# Patient Record
Sex: Female | Born: 1940
Health system: Southern US, Community
[De-identification: ages and names within clinical notes are randomized; demographics above are authoritative.]

## PROBLEM LIST (undated history)

## (undated) DIAGNOSIS — R569 Unspecified convulsions: Secondary | ICD-10-CM

## (undated) DIAGNOSIS — R32 Unspecified urinary incontinence: Secondary | ICD-10-CM

## (undated) DIAGNOSIS — J302 Other seasonal allergic rhinitis: Secondary | ICD-10-CM

## (undated) DIAGNOSIS — H269 Unspecified cataract: Secondary | ICD-10-CM

## (undated) DIAGNOSIS — I10 Essential (primary) hypertension: Secondary | ICD-10-CM

## (undated) DIAGNOSIS — E119 Type 2 diabetes mellitus without complications: Secondary | ICD-10-CM

## (undated) DIAGNOSIS — M1990 Unspecified osteoarthritis, unspecified site: Secondary | ICD-10-CM

## (undated) HISTORY — PX: TUBAL LIGATION: SHX77

## (undated) HISTORY — PX: EYE SURGERY: SHX253

---

## 1997-08-28 ENCOUNTER — Ambulatory Visit (HOSPITAL_COMMUNITY): Admission: RE | Admit: 1997-08-28 | Discharge: 1997-08-28 | Payer: Self-pay | Admitting: Family Medicine

## 1997-09-06 ENCOUNTER — Ambulatory Visit (HOSPITAL_COMMUNITY): Admission: RE | Admit: 1997-09-06 | Discharge: 1997-09-06 | Payer: Self-pay | Admitting: Family Medicine

## 2006-06-01 ENCOUNTER — Encounter: Admission: RE | Admit: 2006-06-01 | Discharge: 2006-06-01 | Payer: Self-pay | Admitting: Family Medicine

## 2007-12-24 ENCOUNTER — Ambulatory Visit (HOSPITAL_COMMUNITY): Admission: RE | Admit: 2007-12-24 | Discharge: 2007-12-24 | Payer: Self-pay | Admitting: Ophthalmology

## 2008-08-03 ENCOUNTER — Other Ambulatory Visit: Admission: RE | Admit: 2008-08-03 | Discharge: 2008-08-03 | Payer: Self-pay | Admitting: Family Medicine

## 2008-08-17 ENCOUNTER — Encounter (INDEPENDENT_AMBULATORY_CARE_PROVIDER_SITE_OTHER): Payer: Self-pay | Admitting: Ophthalmology

## 2008-08-17 ENCOUNTER — Ambulatory Visit (HOSPITAL_COMMUNITY): Admission: RE | Admit: 2008-08-17 | Discharge: 2008-08-18 | Payer: Self-pay | Admitting: Ophthalmology

## 2010-06-24 LAB — BASIC METABOLIC PANEL
CO2: 25 mEq/L (ref 19–32)
Calcium: 9.6 mg/dL (ref 8.4–10.5)
GFR calc Af Amer: >60 mL/min (ref >60)
Glucose, Bld: 123 mg/dL — ABNORMAL HIGH (ref 70–99)
Sodium: 140 mEq/L (ref 135–145)

## 2010-06-24 LAB — GLUCOSE, CAPILLARY
Glucose-Capillary: 135 mg/dL — ABNORMAL HIGH (ref 70–99)
Glucose-Capillary: 213 mg/dL — ABNORMAL HIGH (ref 70–99)

## 2010-06-24 LAB — CBC
Hemoglobin: 13.3 g/dL (ref 12.0–15.0)
MCHC: 33.3 g/dL (ref 30.0–36.0)
MCV: 77.8 fL — ABNORMAL LOW (ref 78.0–100.0)
WBC: 7.9 10*3/uL (ref 4.0–10.5)

## 2010-07-30 NOTE — Op Note (Signed)
NAME:  Kelly Golden, Kelly Golden NO.:  [id number]   MEDICAL RECORD NO.:  [id number]          PATIENT TYPE:  OIB   LOCATION:  5127                         FACILITY:  [facility]   PHYSICIAN:  Beulah Gandy. Ashley Royalty, M.D. DATE OF BIRTH:  1940/07/26   DATE OF PROCEDURE:  08/17/2008  DATE OF DISCHARGE:                               OPERATIVE REPORT   ADMISSION DIAGNOSIS:  Retained lens material, anterior chamber  intraocular lens, left eye.   PROCEDURES:  Pars plana vitrectomy, retinal photocoagulation, removal of  intraocular lens, removal of retained lens material, placement of  secondary intraocular lens with suture, and gas fluid exchange of the  left eye.   SURGEON:  Beulah Gandy. Ashley Royalty, MD   ASSISTANT:  Rosalie Doctor, MA   ANESTHESIA:  General.   DETAILS:  Usual prep and drape.  Peritomy from 8 o'clock around to 4  o'clock to expose the superior globe.  Scleral flaps were raised at 3  and 9 o'clock in anticipation of IOL suture.  The 25-gauge trocars were  placed at 10, 2, and 4 o'clock.  A three-layered corneal wound was  performed between 10 and 2 o'clock.  The pars plana vitrectomy was begun  in the pupillary axis.  The lighted pick and the cutter were placed  through the trocars into the vitreous cavity.  Retained lens material  was encountered and removed carefully under low suction and rapid  cutting.  Provisc was placed on the corneal surface, and the BIOM  viewing system was moved into place.  The vitrectomy was carried down to  the vitreous base for 360 degrees and it was trimmed.  All lens remnants  were removed.  The instruments were removed from the eye and the  indirect ophthalmoscope laser was moved into place.  Burns 629 were  placed in 2 rows in the retinal periphery.  The power was 360  milliwatts, 1000 microns each, and 0.1 seconds each.  The 3-layered  corneoscleral wound was opened, and the anterior chamber intraocular  lens was dialed out of the anterior  chamber.  Two Prolene sutures were  passed behind the iris and anterior to the capsular remnants from 3  o'clock to 9 o'clock.  The sutures were externalized through the corneal  wound.  A new intraocular lens made by [employer] model  CZ70BD, power 20.5 D, length 12.5 mm, optic 7.0 mm, serial number  [id number] was brought onto the field, expiration date March 2014.  The Prolene sutures were attached to the eyelets of the lens, and the  lens was placed in the posterior chamber, then dialed into place.  The  sutures were knotted externally beneath the scleral flaps.  The  vitrectomy was again performed removing additional pigment granules from  the vitreous cavity.  Wound was closed with interrupted 10-0 nylon  sutures.  Additional sutures were required to assure that there was no  leakage from the wound.  The wound was closed until tight.  A 30% gas  fluid exchange was carried out in the vitreous cavity.  The instruments  were removed from the eye, and the trocars were  removed from the pars  plana area.  The conjunctiva was closed with 7-0 chromic suture.  Polymyxin and gentamicin were irrigated into [place] space.  Atropine  solution was applied.  Marcaine was injected around the globe for postop  pain.  Decadron 10 mg was injected into the lower subconjunctival space.  The closing pressure was 10 with a Baer keratometer.  Polysporin and  patch and shield were placed.   COMPLICATIONS:  None.   DURATION:  Two hours.   The patient was awakened and taken to recovery room in satisfactory  condition.      Beulah Gandy. Ashley Royalty, M.D.  Electronically Signed     JDM/MEDQ  D:  08/17/2008  T:  08/18/2008  Job:  [id number]

## 2010-08-02 NOTE — Op Note (Signed)
NAME:  Kelly Golden, LARGE NO.:  [id number]   MEDICAL RECORD NO.:  [id number]           PATIENT TYPE:   LOCATION:                                 FACILITY:   PHYSICIAN:  Salley Scarlet., M.D.DATE OF BIRTH:  1940/07/31   DATE OF PROCEDURE:  DATE OF DISCHARGE:                               OPERATIVE REPORT   PREOPERATIVE DIAGNOSIS:  Immature cataract, left eye.   POSTOPERATIVE DIAGNOSIS:  Immature cataract, left eye.   OPERATIONS:  Kelman phacoemulsification cataract left eye with  intraocular lens implantation.   ANESTHESIA:  Local using Xylocaine 2% with Marcaine and 0.75% [place].   JUSTIFICATION FOR PROCEDURE:  This is a 70 year old lady who complains  of blurring of vision associated with difficulty seeing to read and  drive.  She was evaluated and found to have bilateral immature cataracts  with a visual acuity best corrected to 20/50 on the right and 20/60 on  the left.  There were bilateral 2+ nuclear cataracts associated  posterior subcapsular opacities.  Cataract extraction with intraocular  lens implantation was recommended.  She was admitted at this time for  that purpose.   PROCEDURE:  Under influence of IV sedation, van Lint akinesia and  retrobulbar anesthesia was given.  The patient was prepped and draped in  the usual manner.  A lid speculum was inserted under the upper and lower  lid of the left eye, and a 4-0 silk traction suture was passed through  the belly of the superior rectus muscle retraction.  A fornix-based  conjunctival flap was turned and hemostasis was achieved by using  cautery.  An incision was made in the sclera at the limbus.  This  incision was dissected down to clear cornea using crescent blade.  A  sideport incision was made at 1:30 o'clock position.  OcuCoat was  injected into the eye through the sideport incision.  The anterior  chamber was then entered through the corneoscleral tunnel incision at  the 11:30 o'clock  position using a 2.75-mm keratome.  An anterior  capsulotomy was done using a bent 25-gauge needle.  The nucleus was  hydrodissected using Xylocaine.  The KPE handpiece was passed into the  eye, and the nucleus was begun to be emulsified.  It was during the  emulsification that the pupil became quite miotic and it became even  more difficult to emulsify the nucleus.  Near the end of the  emulsification, it was noted that there was a tear in the posterior  capsule.  The residual nucleus was emulsified and cortical material was  aspirated.  Anterior chamber became to be filled with vitreous.  An  anterior vitrectomy was done.  The pupils were constricted and the  anterior chamber was reformed using Miochol.  OcuCoat was injected into  the eye.  The corneoscleral wound was widened to accommodate a PMMA  anterior chamber lens.  The lens was seated into the eye across the  pupil and rotated in a horizontal fashion.  A peripheral iridectomy was  made in the iris.  The wound was tested to make sure there was no  vitreous in  the wound or in the anterior chamber.  The corneoscleral  wound was then closed using a combination of interrupted sutures of 8-0  Vicryl and 10-0 nylon.  After ascertaining that the wound was airtight  and watertight, the conjunctiva was closed over the wound using thermal  cautery.  A 1 mL of Celestone and 0.5 mL of gentamicin were injected  subconjunctivally.  Maxitrol ophthalmic ointment and Pilocarpine  ointment were applied along with a patch and Fox shield.  The patient  tolerated the procedure well and was discharged to the postanesthesia  recovery room in satisfactory condition.  She was  instructed to rest today, to take Vicodin every 4 hours as needed for  pain and see me in the office tomorrow for further evaluation.   DISCHARGE DIAGNOSIS:  Immature cataract, left eye.      Salley Scarlet., M.D.  Electronically Signed     TB/MEDQ  D:  12/26/2007  T:   12/27/2007  Job:  [id number]

## 2010-12-17 LAB — URINALYSIS, ROUTINE W REFLEX MICROSCOPIC
Bilirubin Urine: NEGATIVE
Protein, ur: NEGATIVE
Specific Gravity, Urine: 1.012

## 2010-12-17 LAB — CBC
HCT: 41.4
MCV: 77.5 — ABNORMAL LOW
RDW: 14.2

## 2010-12-17 LAB — GLUCOSE, CAPILLARY: Glucose-Capillary: 119 — ABNORMAL HIGH

## 2010-12-17 LAB — BASIC METABOLIC PANEL: Calcium: 9.4

## 2010-12-23 ENCOUNTER — Emergency Department (HOSPITAL_COMMUNITY)
Admission: EM | Admit: 2010-12-23 | Discharge: 2010-12-23 | Disposition: A | Discharge status: Home / Self Care | Payer: Medicare Other | Attending: Emergency Medicine | Admitting: Emergency Medicine

## 2010-12-23 DIAGNOSIS — E119 Type 2 diabetes mellitus without complications: Secondary | ICD-10-CM | POA: Insufficient documentation

## 2010-12-23 DIAGNOSIS — S0180XA Unspecified open wound of other part of head, initial encounter: Secondary | ICD-10-CM | POA: Insufficient documentation

## 2010-12-23 DIAGNOSIS — I10 Essential (primary) hypertension: Secondary | ICD-10-CM | POA: Insufficient documentation

## 2010-12-23 DIAGNOSIS — Y92009 Unspecified place in unspecified non-institutional (private) residence as the place of occurrence of the external cause: Secondary | ICD-10-CM | POA: Insufficient documentation

## 2010-12-23 DIAGNOSIS — W010XXA Fall on same level from slipping, tripping and stumbling without subsequent striking against object, initial encounter: Secondary | ICD-10-CM | POA: Insufficient documentation

## 2010-12-27 ENCOUNTER — Emergency Department (HOSPITAL_COMMUNITY)
Admission: EM | Admit: 2010-12-27 | Discharge: 2010-12-27 | Disposition: A | Discharge status: Home / Self Care | Payer: Medicare Other | Attending: Emergency Medicine | Admitting: Emergency Medicine

## 2010-12-27 DIAGNOSIS — E119 Type 2 diabetes mellitus without complications: Secondary | ICD-10-CM | POA: Insufficient documentation

## 2010-12-27 DIAGNOSIS — Z4802 Encounter for removal of sutures: Secondary | ICD-10-CM | POA: Insufficient documentation

## 2010-12-27 DIAGNOSIS — I10 Essential (primary) hypertension: Secondary | ICD-10-CM | POA: Insufficient documentation

## 2011-11-06 ENCOUNTER — Ambulatory Visit (INDEPENDENT_AMBULATORY_CARE_PROVIDER_SITE_OTHER): Payer: Medicare Other | Admitting: Ophthalmology

## 2011-11-06 DIAGNOSIS — E11319 Type 2 diabetes mellitus with unspecified diabetic retinopathy without macular edema: Secondary | ICD-10-CM

## 2011-11-06 DIAGNOSIS — E1165 Type 2 diabetes mellitus with hyperglycemia: Secondary | ICD-10-CM

## 2011-11-06 DIAGNOSIS — I10 Essential (primary) hypertension: Secondary | ICD-10-CM

## 2011-11-06 DIAGNOSIS — H2510 Age-related nuclear cataract, unspecified eye: Secondary | ICD-10-CM

## 2011-11-06 DIAGNOSIS — H35039 Hypertensive retinopathy, unspecified eye: Secondary | ICD-10-CM

## 2011-11-06 DIAGNOSIS — H43819 Vitreous degeneration, unspecified eye: Secondary | ICD-10-CM

## 2011-11-20 ENCOUNTER — Other Ambulatory Visit (INDEPENDENT_AMBULATORY_CARE_PROVIDER_SITE_OTHER): Payer: Medicare Other | Admitting: Ophthalmology

## 2011-11-20 DIAGNOSIS — H3581 Retinal edema: Secondary | ICD-10-CM

## 2011-12-01 ENCOUNTER — Ambulatory Visit (INDEPENDENT_AMBULATORY_CARE_PROVIDER_SITE_OTHER): Payer: Medicare Other | Admitting: Ophthalmology

## 2011-12-01 DIAGNOSIS — E1139 Type 2 diabetes mellitus with other diabetic ophthalmic complication: Secondary | ICD-10-CM

## 2011-12-01 DIAGNOSIS — E1165 Type 2 diabetes mellitus with hyperglycemia: Secondary | ICD-10-CM

## 2011-12-01 DIAGNOSIS — H3581 Retinal edema: Secondary | ICD-10-CM

## 2012-04-07 ENCOUNTER — Ambulatory Visit (INDEPENDENT_AMBULATORY_CARE_PROVIDER_SITE_OTHER): Payer: Medicare Other | Admitting: Ophthalmology

## 2012-04-07 DIAGNOSIS — H43819 Vitreous degeneration, unspecified eye: Secondary | ICD-10-CM

## 2012-04-07 DIAGNOSIS — I10 Essential (primary) hypertension: Secondary | ICD-10-CM

## 2012-04-07 DIAGNOSIS — H35039 Hypertensive retinopathy, unspecified eye: Secondary | ICD-10-CM

## 2012-04-07 DIAGNOSIS — E11319 Type 2 diabetes mellitus with unspecified diabetic retinopathy without macular edema: Secondary | ICD-10-CM

## 2012-04-07 DIAGNOSIS — H2510 Age-related nuclear cataract, unspecified eye: Secondary | ICD-10-CM

## 2012-04-07 DIAGNOSIS — E1165 Type 2 diabetes mellitus with hyperglycemia: Secondary | ICD-10-CM

## 2012-06-04 ENCOUNTER — Other Ambulatory Visit: Payer: Self-pay | Admitting: Ophthalmology

## 2012-06-04 NOTE — H&P (Signed)
  Pre-operative History and Physical for Ophthalmic Surgery  Kelly Golden 06/04/2012                  Chief Complaint: Decreased vision right eye  Diagnosis:  Combined Cataract  Allergies not on file Seasonal Allergies Only Prior to Admission medications   Not on File    Planned Procedure:                                       Phacoemulsification, Posterior Chamber Intra-ocular Lens Right Eye                                       Acrysof MA50BM + 21.50 Diopter PC IOL for implant OD    There were no vitals filed for this visit.  Pulse: 80         Temp: NE        Resp:  16     ROS:   No past surgical history on file.   History   Social History  . Marital Status: Married    Spouse Name: N/A    Number of Children: N/A  . Years of Education: N/A   Occupational History  . Not on file.   Social History Main Topics  . Smoking status: Not on file  . Smokeless tobacco: Not on file  . Alcohol Use: Not on file  . Drug Use: Not on file  . Sexually Active: Not on file   Other Topics Concern  . Not on file   Social History Narrative  . No narrative on file     The following examination is for anesthesia clearance for minimally invasive Ophthalmic surgery. It is primarily to document heart and lung findings and is not intended to elucidate unknown general medical conditions inclusive of abdominal masses, lung lesions, etc.   General Constitution:  within normal limits   Alertness/Orientation:  Person, time place     yes   HEENT:  Eye Findings:  Combined Cataract                   right eye  Neck: supple without masses  Chest/Lungs: clear to auscultation  Cardiac: Normal S1 and S2 without Murmur, S3 or S4  Neuro: non-focal   Impression:  Combined Cataract  Planned Procedure:  Phacoemulsification, Posterior Chamber Intraocular Lens, Right Eye     Shade Flood, MD

## 2012-06-22 ENCOUNTER — Encounter (HOSPITAL_COMMUNITY): Payer: Self-pay

## 2012-06-24 ENCOUNTER — Encounter (HOSPITAL_COMMUNITY)
Admission: RE | Admit: 2012-06-24 | Discharge: 2012-06-24 | Disposition: A | Discharge status: Home / Self Care | Payer: Medicare Other | Source: Ambulatory Visit | Attending: Ophthalmology | Admitting: Ophthalmology

## 2012-06-24 ENCOUNTER — Encounter (HOSPITAL_COMMUNITY): Payer: Self-pay

## 2012-06-24 HISTORY — DX: Other seasonal allergic rhinitis: J30.2

## 2012-06-24 HISTORY — DX: Unspecified convulsions: R56.9

## 2012-06-24 HISTORY — DX: Essential (primary) hypertension: I10

## 2012-06-24 HISTORY — DX: Type 2 diabetes mellitus without complications: E11.9

## 2012-06-24 LAB — CBC
HCT: 41.2 % (ref 36.0–46.0)
Hemoglobin: 14.2 g/dL (ref 12.0–15.0)
MCHC: 34.5 g/dL (ref 30.0–36.0)
RBC: 5.52 MIL/uL — ABNORMAL HIGH (ref 3.87–5.11)

## 2012-06-24 LAB — BASIC METABOLIC PANEL
BUN: 20 mg/dL (ref 6–23)
Chloride: 100 mEq/L (ref 96–112)
GFR calc Af Amer: 71 mL/min — ABNORMAL LOW (ref >90)
GFR calc non Af Amer: 61 mL/min — ABNORMAL LOW (ref >90)
Glucose, Bld: 99 mg/dL (ref 70–99)
Potassium: 3.9 mEq/L (ref 3.5–5.1)
Sodium: 136 mEq/L (ref 135–145)

## 2012-06-24 NOTE — Pre-Procedure Instructions (Signed)
Kelly Golden  06/24/2012   Your procedure is scheduled on:  Wednesday, April 16th  Report to [facility] at 0830 AM.  Call this number if you have problems the morning of surgery: [phone]   Remember:   Do not eat food or drink liquids after midnight.    Take these medicines the morning of surgery with A SIP OF WATER: Diltazem   Do not wear jewelry, make-up or nail polish.  Do not wear lotions, powders, or perfumes, deodorant.  Do not shave 48 hours prior to surgery.   Do not bring valuables to the hospital.  Contacts, dentures or bridgework may not be worn into surgery.  Leave suitcase in the car. After surgery it may be brought to your room.  For patients admitted to the hospital, checkout time is 11:00 AM the day of discharge.   Patients discharged the day of surgery will not be allowed to drive home.    Special Instructions: Shower using CHG 2 nights before surgery and the night before surgery.  If you shower the day of surgery use CHG.  Use special wash - you have one bottle of CHG for all showers.  You should use approximately 1/3 of the bottle for each shower.   Please read over the following fact sheets that you were given: Pain Booklet, Coughing and Deep Breathing and Surgical Site Infection Prevention

## 2012-06-30 ENCOUNTER — Encounter (HOSPITAL_COMMUNITY): Payer: Self-pay | Admitting: Anesthesiology

## 2012-06-30 ENCOUNTER — Ambulatory Visit (HOSPITAL_COMMUNITY)
Admission: RE | Admit: 2012-06-30 | Discharge: 2012-06-30 | Disposition: A | Discharge status: Home / Self Care | Payer: Medicare Other | Source: Ambulatory Visit | Attending: Ophthalmology | Admitting: Ophthalmology

## 2012-06-30 ENCOUNTER — Ambulatory Visit (HOSPITAL_COMMUNITY): Payer: Medicare Other | Admitting: Anesthesiology

## 2012-06-30 ENCOUNTER — Encounter (HOSPITAL_COMMUNITY): Payer: Self-pay | Admitting: *Deleted

## 2012-06-30 ENCOUNTER — Encounter (HOSPITAL_COMMUNITY)
Admission: RE | Disposition: A | Discharge status: Home / Self Care | Payer: Self-pay | Source: Ambulatory Visit | Attending: Ophthalmology

## 2012-06-30 DIAGNOSIS — H2510 Age-related nuclear cataract, unspecified eye: Secondary | ICD-10-CM | POA: Insufficient documentation

## 2012-06-30 HISTORY — PX: CATARACT EXTRACTION W/PHACO: SHX586

## 2012-06-30 LAB — GLUCOSE, CAPILLARY
Glucose-Capillary: 141 mg/dL — ABNORMAL HIGH (ref 70–99)
Glucose-Capillary: 105 mg/dL — ABNORMAL HIGH (ref 70–99)

## 2012-06-30 SURGERY — PHACOEMULSIFICATION, CATARACT, WITH IOL INSERTION
Anesthesia: Monitor Anesthesia Care | Site: Eye | Laterality: Right | Wound class: Clean

## 2012-06-30 MED ORDER — ONDANSETRON HCL 4 MG/2ML IJ SOLN: 4.0000 mg | Freq: Four times a day (QID) | INTRAMUSCULAR | Status: DC | PRN

## 2012-06-30 MED ORDER — BACITRACIN-POLYMYXIN B 500-10000 UNIT/GM OP OINT
TOPICAL_OINTMENT | OPHTHALMIC | Status: AC
  Filled 2012-06-30: qty 3.5

## 2012-06-30 MED ORDER — HYPROMELLOSE (GONIOSCOPIC) 2.5 % OP SOLN
OPHTHALMIC | Status: AC
  Filled 2012-06-30: qty 15

## 2012-06-30 MED ORDER — BUPIVACAINE HCL (PF) 0.75 % IJ SOLN
INTRAMUSCULAR | Status: AC
  Filled 2012-06-30: qty 10

## 2012-06-30 MED ORDER — PHENYLEPHRINE HCL 2.5 % OP SOLN
1.0000 [drp] | OPHTHALMIC | Status: DC | PRN
  Administered 2012-06-30: 1 [drp] via OPHTHALMIC
  Filled 2012-06-30: qty 3

## 2012-06-30 MED ORDER — NA CHONDROIT SULF-NA HYALURON 40-30 MG/ML IO SOLN
INTRAOCULAR | Status: DC | PRN
  Administered 2012-06-30 (×2): 0.5 mL via INTRAOCULAR

## 2012-06-30 MED ORDER — OXYCODONE HCL 5 MG PO TABS: 5.0000 mg | ORAL_TABLET | Freq: Once | ORAL | Status: DC | PRN

## 2012-06-30 MED ORDER — DEXAMETHASONE SODIUM PHOSPHATE 10 MG/ML IJ SOLN
INTRAMUSCULAR | Status: DC | PRN
  Administered 2012-06-30: 10 mg

## 2012-06-30 MED ORDER — FENTANYL CITRATE 0.05 MG/ML IJ SOLN: 25.0000 ug | INTRAMUSCULAR | Status: DC | PRN

## 2012-06-30 MED ORDER — TRIAMCINOLONE ACETONIDE 40 MG/ML IJ SUSP
INTRAMUSCULAR | Status: AC
  Filled 2012-06-30: qty 5

## 2012-06-30 MED ORDER — EPINEPHRINE HCL 1 MG/ML IJ SOLN
INTRAMUSCULAR | Status: AC
  Filled 2012-06-30: qty 1

## 2012-06-30 MED ORDER — DEXAMETHASONE SODIUM PHOSPHATE 10 MG/ML IJ SOLN
INTRAMUSCULAR | Status: AC
  Filled 2012-06-30: qty 1

## 2012-06-30 MED ORDER — ACETYLCHOLINE CHLORIDE 1:100 IO SOLR
INTRAOCULAR | Status: AC
  Filled 2012-06-30: qty 1

## 2012-06-30 MED ORDER — PROVISC 10 MG/ML IO SOLN
INTRAOCULAR | Status: DC | PRN
  Administered 2012-06-30: 8.5 mg via INTRAOCULAR

## 2012-06-30 MED ORDER — NA CHONDROIT SULF-NA HYALURON 40-30 MG/ML IO SOLN
INTRAOCULAR | Status: AC
  Filled 2012-06-30: qty 0.5

## 2012-06-30 MED ORDER — PROPOFOL 10 MG/ML IV BOLUS
INTRAVENOUS | Status: DC | PRN
  Administered 2012-06-30: 40 mg via INTRAVENOUS

## 2012-06-30 MED ORDER — HYPROMELLOSE (GONIOSCOPIC) 2.5 % OP SOLN
OPHTHALMIC | Status: DC | PRN
  Administered 2012-06-30: 2 [drp] via OPHTHALMIC

## 2012-06-30 MED ORDER — BACITRACIN-POLYMYXIN B 500-10000 UNIT/GM OP OINT
TOPICAL_OINTMENT | OPHTHALMIC | Status: DC | PRN
  Administered 2012-06-30: 1 via OPHTHALMIC

## 2012-06-30 MED ORDER — EPINEPHRINE HCL 1 MG/ML IJ SOLN
INTRAOCULAR | Status: DC | PRN
  Administered 2012-06-30: 11:00:00

## 2012-06-30 MED ORDER — SODIUM CHLORIDE 0.9 % IV SOLN
INTRAVENOUS | Status: DC | PRN
  Administered 2012-06-30: 10:00:00 via INTRAVENOUS

## 2012-06-30 MED ORDER — LIDOCAINE HCL 2 % IJ SOLN
INTRAMUSCULAR | Status: DC | PRN
  Administered 2012-06-30: 11:00:00 via RETROBULBAR

## 2012-06-30 MED ORDER — GATIFLOXACIN 0.5 % OP SOLN
1.0000 [drp] | OPHTHALMIC | Status: DC | PRN
  Administered 2012-06-30: 1 [drp] via OPHTHALMIC
  Filled 2012-06-30: qty 2.5

## 2012-06-30 MED ORDER — PREDNISOLONE ACETATE 1 % OP SUSP
1.0000 [drp] | OPHTHALMIC | Status: AC
  Administered 2012-06-30: 1 [drp] via OPHTHALMIC
  Filled 2012-06-30: qty 5

## 2012-06-30 MED ORDER — OXYCODONE HCL 5 MG/5ML PO SOLN: 5.0000 mg | Freq: Once | ORAL | Status: DC | PRN

## 2012-06-30 MED ORDER — ACETYLCHOLINE CHLORIDE 1:100 IO SOLR
INTRAOCULAR | Status: DC | PRN
  Administered 2012-06-30: 20 mg via INTRAOCULAR

## 2012-06-30 MED ORDER — CEFAZOLIN SUBCONJUNCTIVAL INJECTION 100 MG/0.5 ML
200.0000 mg | INJECTION | SUBCONJUNCTIVAL | Status: DC
  Filled 2012-06-30: qty 1

## 2012-06-30 MED ORDER — BSS IO SOLN
INTRAOCULAR | Status: AC
  Filled 2012-06-30: qty 500

## 2012-06-30 MED ORDER — TETRACAINE HCL 0.5 % OP SOLN
2.0000 [drp] | OPHTHALMIC | Status: AC
  Administered 2012-06-30 (×2): 2 [drp] via OPHTHALMIC
  Filled 2012-06-30: qty 2

## 2012-06-30 MED ORDER — SODIUM HYALURONATE 10 MG/ML IO SOLN
INTRAOCULAR | Status: AC
  Filled 2012-06-30: qty 0.85

## 2012-06-30 MED ORDER — LIDOCAINE HCL 2 % IJ SOLN
INTRAMUSCULAR | Status: AC
  Filled 2012-06-30: qty 20

## 2012-06-30 MED ORDER — FENTANYL CITRATE 0.05 MG/ML IJ SOLN
INTRAMUSCULAR | Status: DC | PRN
  Administered 2012-06-30: 25 ug via INTRAVENOUS
  Administered 2012-06-30: 75 ug via INTRAVENOUS

## 2012-06-30 MED ORDER — TETRACAINE HCL 0.5 % OP SOLN
OPHTHALMIC | Status: AC
  Filled 2012-06-30: qty 2

## 2012-06-30 MED ORDER — CEFAZOLIN SUBCONJUNCTIVAL INJECTION 100 MG/0.5 ML
INJECTION | SUBCONJUNCTIVAL | Status: DC | PRN
  Administered 2012-06-30: 100 mg via SUBCONJUNCTIVAL

## 2012-06-30 MED ORDER — ACETAZOLAMIDE SODIUM 500 MG IJ SOLR
INTRAMUSCULAR | Status: AC
  Filled 2012-06-30: qty 500

## 2012-06-30 SURGICAL SUPPLY — 64 items
APL SRG 3 HI ABS STRL LF PLS (MISCELLANEOUS) ×1
APPLICATOR COTTON TIP 6IN STRL (MISCELLANEOUS) ×2 IMPLANT
APPLICATOR DR MATTHEWS STRL (MISCELLANEOUS) ×2 IMPLANT
BAG FLD CLT MN 6.25X3.5 (WOUND CARE) ×1
BAG MINI COLL DRAIN (WOUND CARE) ×2 IMPLANT
BLADE EYE MINI 60D BEAVER (BLADE) IMPLANT
BLADE KERATOME 2.75 (BLADE) ×2 IMPLANT
BLADE STAB KNIFE 15DEG (BLADE) IMPLANT
CANNULA ANTERIOR CHAMBER 27GA (MISCELLANEOUS) IMPLANT
CLOTH BEACON ORANGE TIMEOUT ST (SAFETY) ×2 IMPLANT
DRAPE OPHTHALMIC 77X100 STRL (CUSTOM PROCEDURE TRAY) ×2 IMPLANT
DRAPE POUCH INSTRU U-SHP 10X18 (DRAPES) ×2 IMPLANT
DRSG TEGADERM 4X4.75 (GAUZE/BANDAGES/DRESSINGS) ×2 IMPLANT
FILTER BLUE MILLIPORE (MISCELLANEOUS) IMPLANT
GLOVE SS BIOGEL STRL SZ 6.5 (GLOVE) ×1 IMPLANT
GLOVE SUPERSENSE BIOGEL SZ 6.5 (GLOVE) ×1
GOWN SRG XL XLNG 56XLVL 4 (GOWN DISPOSABLE) ×1 IMPLANT
GOWN STRL NON-REIN LRG LVL3 (GOWN DISPOSABLE) ×2 IMPLANT
GOWN STRL NON-REIN XL XLG LVL4 (GOWN DISPOSABLE) ×2
KIT BASIN OR (CUSTOM PROCEDURE TRAY) ×2 IMPLANT
KIT ROOM TURNOVER OR (KITS) IMPLANT
KNIFE GRIESHABER SHARP 2.5MM (MISCELLANEOUS) ×2 IMPLANT
LENS IOL ACRYSOF MP POST 21.0 (Intraocular Lens) ×1 IMPLANT
MASK EYE SHIELD (GAUZE/BANDAGES/DRESSINGS) ×1 IMPLANT
NDL 18GX1X1/2 (RX/OR ONLY) (NEEDLE) IMPLANT
NDL 25GX 5/8IN NON SAFETY (NEEDLE) ×1 IMPLANT
NDL FILTER BLUNT 18X1 1/2 (NEEDLE) IMPLANT
NDL HYPO 30X.5 LL (NEEDLE) ×2 IMPLANT
NEEDLE 18GX1X1/2 (RX/OR ONLY) (NEEDLE) IMPLANT
NEEDLE 22X1 1/2 (OR ONLY) (NEEDLE) ×2 IMPLANT
NEEDLE 25GX 5/8IN NON SAFETY (NEEDLE) ×2 IMPLANT
NEEDLE FILTER BLUNT 18X 1/2SAF (NEEDLE) ×1
NEEDLE FILTER BLUNT 18X1 1/2 (NEEDLE) ×1 IMPLANT
NEEDLE HYPO 30X.5 LL (NEEDLE) ×4 IMPLANT
NS IRRIG 1000ML POUR BTL (IV SOLUTION) ×2 IMPLANT
PACK CATARACT CUSTOM (CUSTOM PROCEDURE TRAY) ×2 IMPLANT
PACK CATARACT MCHSCP (PACKS) ×2 IMPLANT
PACK COMBINED CATERACT/VIT 23G (OPHTHALMIC RELATED) IMPLANT
PAD ARMBOARD 7.5X6 YLW CONV (MISCELLANEOUS) ×4 IMPLANT
PAD EYE OVAL STERILE LF (GAUZE/BANDAGES/DRESSINGS) ×1 IMPLANT
PHACO TIP KELMAN 45DEG (TIP) IMPLANT
PROBE ANTERIOR 20G W/INFUS NDL (MISCELLANEOUS) ×1 IMPLANT
RING MALYGIN (MISCELLANEOUS) IMPLANT
ROLLS DENTAL (MISCELLANEOUS) IMPLANT
SHUTTLE MONARCH TYPE A (NEEDLE) ×2 IMPLANT
SOLUTION ANTI FOG 6CC (MISCELLANEOUS) IMPLANT
SPEAR EYE SURG WECK-CEL (MISCELLANEOUS) ×2 IMPLANT
SUT ETHILON 10-0 CS-B-6CS-B-6 (SUTURE)
SUT ETHILON 5 0 P 3 18 (SUTURE)
SUT ETHILON 9 0 TG140 8 (SUTURE) IMPLANT
SUT NYLON ETHILON 5-0 P-3 1X18 (SUTURE) IMPLANT
SUT PLAIN 6 0 TG1408 (SUTURE) IMPLANT
SUT POLY NON ABSORB 10-0 8 STR (SUTURE) IMPLANT
SUT VICRYL 6 0 S 29 12 (SUTURE) IMPLANT
SUTURE EHLN 10-0 CS-B-6CS-B-6 (SUTURE) IMPLANT
SYR 20CC LL (SYRINGE) IMPLANT
SYR 5ML LL (SYRINGE) IMPLANT
SYR TB 1ML LUER SLIP (SYRINGE) IMPLANT
SYRINGE 10CC LL (SYRINGE) IMPLANT
TIP ABS 45DEG FLARED 0.9MM (TIP) ×2 IMPLANT
TOWEL OR 17X24 6PK STRL BLUE (TOWEL DISPOSABLE) ×4 IMPLANT
WATER STERILE IRR 1000ML POUR (IV SOLUTION) ×2 IMPLANT
WIPE INSTRUMENT ADHESIVE BACK (MISCELLANEOUS) ×2 IMPLANT
WIPE INSTRUMENT VISIWIPE 73X73 (MISCELLANEOUS) ×2 IMPLANT

## 2012-06-30 NOTE — Transfer of Care (Signed)
Immediate Anesthesia Transfer of Care Note  Patient: Kelly Golden  Procedure(s) Performed: Procedure(s): CATARACT EXTRACTION PHACO AND INTRAOCULAR LENS PLACEMENT (IOC) (Right)  Patient Location: PACU and Short Stay  Anesthesia Type:MAC  Level of Consciousness: awake, alert , oriented and sedated  Airway & Oxygen Therapy: Patient Spontanous Breathing  Post-op Assessment: Report given to PACU RN, Post -op Vital signs reviewed and stable and Patient moving all extremities  Post vital signs: Reviewed and stable  Complications: No apparent anesthesia complications

## 2012-06-30 NOTE — Interval H&P Note (Signed)
History and Physical Interval Note:  06/30/2012 9:49 AM  Kelly Golden  has presented today for surgery, with the diagnosis of Combined Cataract Right Eye  The various methods of treatment have been discussed with the patient and family. After consideration of risks, benefits and other options for treatment, the patient has consented to  Procedure(s): CATARACT EXTRACTION PHACO AND INTRAOCULAR LENS PLACEMENT (IOC) (Right) as a surgical intervention .  The patient's history has been reviewed, patient examined, no change in status, stable for surgery.  I have reviewed the patient's chart and labs.  Questions were answered to the patient's satisfaction.    Her best corrected acuity was 20/70 with manifiest refraction. She reports difficulty driving at night, recognizing faces and difficulty reading. Her vision decrease is consistent with her Combined 3+ nuclear/Cortical Cataract OD. Risks, benefits, alternatives to Cataract surgery were discussed and the patient expresses the desire to proceed with surgical correction of her Cataract OD.  Her AScan to assess implant lens power suggests +21.00 diopter power for an Acrysof MA50 BM PC IOL will be optimal.   [name]

## 2012-06-30 NOTE — Anesthesia Procedure Notes (Addendum)
Procedure Name: MAC Date/Time: 06/30/2012 10:50 AM Performed by: Fransisca Kaufmann Pre-anesthesia Checklist: Emergency Drugs available, Suction available and Patient being monitored Patient Re-evaluated:Patient Re-evaluated prior to inductionOxygen Delivery Method: Nasal cannula Preoxygenation: Pre-oxygenation with 100% oxygen Intubation Type: IV induction Placement Confirmation: positive ETCO2

## 2012-06-30 NOTE — Anesthesia Preprocedure Evaluation (Signed)
Anesthesia Evaluation  Patient identified by MRN, date of birth, ID band Patient awake    Reviewed: Allergy & Precautions, H&P , NPO status , Patient's Chart, lab work & pertinent test results  Airway Mallampati: II  Neck ROM: full    Dental   Pulmonary former smoker,          Cardiovascular hypertension,     Neuro/Psych Seizures -,     GI/Hepatic   Endo/Other  diabetes, Type 2obese  Renal/GU      Musculoskeletal   Abdominal   Peds  Hematology   Anesthesia Other Findings   Reproductive/Obstetrics                           Anesthesia Physical Anesthesia Plan  ASA: II  Anesthesia Plan: MAC   Post-op Pain Management:    Induction: Intravenous  Airway Management Planned: Simple Face Mask  Additional Equipment:   Intra-op Plan:   Post-operative Plan:   Informed Consent: I have reviewed the patients History and Physical, chart, labs and discussed the procedure including the risks, benefits and alternatives for the proposed anesthesia with the patient or authorized representative who has indicated his/her understanding and acceptance.     Plan Discussed with: CRNA and Surgeon  Anesthesia Plan Comments:         Anesthesia Quick Evaluation

## 2012-06-30 NOTE — H&P (View-Only) (Signed)
  Pre-operative History and Physical for Ophthalmic Surgery  Kelly Golden 06/04/2012                  Chief Complaint: Decreased vision right eye  Diagnosis:  Combined Cataract  Allergies not on file Seasonal Allergies Only Prior to Admission medications   Not on File    Planned Procedure:                                       Phacoemulsification, Posterior Chamber Intra-ocular Lens Right Eye                                       Acrysof MA50BM + 21.50 Diopter PC IOL for implant OD    There were no vitals filed for this visit.  Pulse: 80         Temp: NE        Resp:  16     ROS:   No past surgical history on file.   History   Social History  . Marital Status: Married    Spouse Name: N/A    Number of Children: N/A  . Years of Education: N/A   Occupational History  . Not on file.   Social History Main Topics  . Smoking status: Not on file  . Smokeless tobacco: Not on file  . Alcohol Use: Not on file  . Drug Use: Not on file  . Sexually Active: Not on file   Other Topics Concern  . Not on file   Social History Narrative  . No narrative on file     The following examination is for anesthesia clearance for minimally invasive Ophthalmic surgery. It is primarily to document heart and lung findings and is not intended to elucidate unknown general medical conditions inclusive of abdominal masses, lung lesions, etc.   General Constitution:  within normal limits   Alertness/Orientation:  Person, time place     yes   HEENT:  Eye Findings:  Combined Cataract                   right eye  Neck: supple without masses  Chest/Lungs: clear to auscultation  Cardiac: Normal S1 and S2 without Murmur, S3 or S4  Neuro: non-focal   Impression:  Combined Cataract  Planned Procedure:  Phacoemulsification, Posterior Chamber Intraocular Lens, Right Eye     Fleetwood Pierron, MD       

## 2012-06-30 NOTE — Op Note (Signed)
Kelly Golden 06/30/2012 Cataract: Combined, Nuclear  Procedure: Phacoemulsification, Posterior Chamber Intra-ocular Lens Operative Eye:  right eye  Surgeon: Shade Flood Estimated Blood Loss: minimal Specimens for Pathology:  None Complications: radial capsule tear during lens insertion  The patient was prepared and draped in the usual manner for ocular surgery on the right eye. A Cook lid speculum was placed. A peripheral clear corneal incision was made at the surgical limbus centered at the 11:00 meridian. A separate clear corneal stab incision was made with a 15 degree blade at the 2:00 meridian to permit bi-manual technique. Viscoat and  Provisc as an underlying layer next to the capsule was instilled into the anterior chamber through that incision.  A keratome was used to create a self sealing incision entering the anterior chamber at the 11:00 meridian. A capsulorhexis was performed using a bent 25g needle. The lens was hydrodissected and the nucleus was hydrodilineated using a Nichammin cannula. The Chang chopper was inserted and used to rotate the lens to insure adequate lens mobility. The phacoemulsification handpiece was inserted and a combined phaco-chop technique was employed, fracturing the lens into separate sections with subsequent removal with the phaco handpiece.   The I/A cannula was used to remove remaining lens cortex. Provisc was instilled and used to deepen the anterior chamber and posterior capsule bag. The Monarch injector was used to place a folded Acrysof MA50BM PC IOL, + 21.00  diopters, into the capsule bag. A Sinskey lens hook was used to dial in the trailing haptic. During the lens placement, the patient developed at radial tear at 3:00. I retrieved the lens haptic with the sinskey hook and placed the haptic and subsequently the optic into the sulcus position. The remaining haptic which was already in the capsule bag was then placed into the sulcus using a dailing  technique with the sinskey hook. A small amount of vitreous was noted at the nasal pupil margin. The anterior vitrectomy cutter was then used to remove the presenting vitreous. Miochol was then used to bring the pupil down. The IOL was well supported when tested by ballotment and centered at the close of the procedure.   BSS was used to bring IOP to the desired range and the wound was checked to insure it was watertight. Subconjunctival injections of Ancef 100/0.48ml and Dexamethasone 0.5 ml of a 10mg /66ml solution were placed without complication. The lid speculum and drapes were removed and the patient's eye was patched with Polymixin/Bacitracin ophthalmic ointment. An eye shield was placed and the patient was transferred alert and conversant from the operating room to the post-operative recovery area.   Shade Flood, MD

## 2012-06-30 NOTE — Preoperative (Signed)
Beta Blockers   Reason not to administer Beta Blockers:Not Applicable 

## 2012-07-01 ENCOUNTER — Encounter (HOSPITAL_COMMUNITY): Payer: Self-pay | Admitting: Ophthalmology

## 2012-07-07 ENCOUNTER — Ambulatory Visit (INDEPENDENT_AMBULATORY_CARE_PROVIDER_SITE_OTHER): Payer: Medicare Other | Admitting: Ophthalmology

## 2012-07-07 DIAGNOSIS — H35039 Hypertensive retinopathy, unspecified eye: Secondary | ICD-10-CM

## 2012-07-07 DIAGNOSIS — H43819 Vitreous degeneration, unspecified eye: Secondary | ICD-10-CM

## 2012-07-07 DIAGNOSIS — E1139 Type 2 diabetes mellitus with other diabetic ophthalmic complication: Secondary | ICD-10-CM

## 2012-07-07 DIAGNOSIS — E11319 Type 2 diabetes mellitus with unspecified diabetic retinopathy without macular edema: Secondary | ICD-10-CM

## 2012-07-07 DIAGNOSIS — I10 Essential (primary) hypertension: Secondary | ICD-10-CM

## 2012-07-07 NOTE — Anesthesia Postprocedure Evaluation (Signed)
Anesthesia Post Note  Patient: Kelly Golden  Procedure(s) Performed: Procedure(s) (LRB): CATARACT EXTRACTION PHACO AND INTRAOCULAR LENS PLACEMENT (IOC) (Right)  Anesthesia type: MAC  Patient location: PACU  Post pain: Pain level controlled and Adequate analgesia  Post assessment: Post-op Vital signs reviewed, Patient's Cardiovascular Status Stable and Respiratory Function Stable  Last Vitals:  Filed Vitals:   06/30/12 1151  BP: 131/46  Pulse: 55  Temp: 36.5 C  Resp: 20    Post vital signs: Reviewed and stable  Level of consciousness: awake, alert  and oriented  Complications: No apparent anesthesia complications

## 2012-07-22 DIAGNOSIS — M25569 Pain in unspecified knee: Secondary | ICD-10-CM | POA: Insufficient documentation

## 2012-10-06 ENCOUNTER — Ambulatory Visit (INDEPENDENT_AMBULATORY_CARE_PROVIDER_SITE_OTHER): Payer: Medicare Other | Admitting: Ophthalmology

## 2012-10-06 ENCOUNTER — Ambulatory Visit (INDEPENDENT_AMBULATORY_CARE_PROVIDER_SITE_OTHER): Payer: Self-pay | Admitting: Ophthalmology

## 2014-04-18 DIAGNOSIS — E119 Type 2 diabetes mellitus without complications: Secondary | ICD-10-CM | POA: Diagnosis not present

## 2014-06-08 DIAGNOSIS — H4011X1 Primary open-angle glaucoma, mild stage: Secondary | ICD-10-CM | POA: Diagnosis not present

## 2014-06-20 DIAGNOSIS — R7309 Other abnormal glucose: Secondary | ICD-10-CM | POA: Diagnosis not present

## 2014-06-20 DIAGNOSIS — M150 Primary generalized (osteo)arthritis: Secondary | ICD-10-CM | POA: Diagnosis not present

## 2014-06-20 DIAGNOSIS — E782 Mixed hyperlipidemia: Secondary | ICD-10-CM | POA: Diagnosis not present

## 2014-06-20 DIAGNOSIS — E0800 Diabetes mellitus due to underlying condition with hyperosmolarity without nonketotic hyperglycemic-hyperosmolar coma (NKHHC): Secondary | ICD-10-CM | POA: Diagnosis not present

## 2014-09-08 DIAGNOSIS — H4011X1 Primary open-angle glaucoma, mild stage: Secondary | ICD-10-CM | POA: Diagnosis not present

## 2014-09-08 DIAGNOSIS — H04123 Dry eye syndrome of bilateral lacrimal glands: Secondary | ICD-10-CM | POA: Diagnosis not present

## 2014-09-11 DIAGNOSIS — H00016 Hordeolum externum left eye, unspecified eyelid: Secondary | ICD-10-CM | POA: Diagnosis not present

## 2014-09-11 DIAGNOSIS — H4011X1 Primary open-angle glaucoma, mild stage: Secondary | ICD-10-CM | POA: Diagnosis not present

## 2014-11-30 DIAGNOSIS — I10 Essential (primary) hypertension: Secondary | ICD-10-CM | POA: Diagnosis not present

## 2014-11-30 DIAGNOSIS — E782 Mixed hyperlipidemia: Secondary | ICD-10-CM | POA: Diagnosis not present

## 2014-11-30 DIAGNOSIS — E0800 Diabetes mellitus due to underlying condition with hyperosmolarity without nonketotic hyperglycemic-hyperosmolar coma (NKHHC): Secondary | ICD-10-CM | POA: Diagnosis not present

## 2014-12-07 DIAGNOSIS — M25561 Pain in right knee: Secondary | ICD-10-CM | POA: Diagnosis not present

## 2014-12-07 DIAGNOSIS — M170 Bilateral primary osteoarthritis of knee: Secondary | ICD-10-CM | POA: Diagnosis not present

## 2014-12-07 DIAGNOSIS — M25562 Pain in left knee: Secondary | ICD-10-CM | POA: Diagnosis not present

## 2014-12-18 DIAGNOSIS — M170 Bilateral primary osteoarthritis of knee: Secondary | ICD-10-CM | POA: Diagnosis not present

## 2014-12-25 DIAGNOSIS — M170 Bilateral primary osteoarthritis of knee: Secondary | ICD-10-CM | POA: Diagnosis not present

## 2015-01-01 DIAGNOSIS — M170 Bilateral primary osteoarthritis of knee: Secondary | ICD-10-CM | POA: Diagnosis not present

## 2015-01-08 DIAGNOSIS — M170 Bilateral primary osteoarthritis of knee: Secondary | ICD-10-CM | POA: Diagnosis not present

## 2015-03-22 DIAGNOSIS — Z1231 Encounter for screening mammogram for malignant neoplasm of breast: Secondary | ICD-10-CM | POA: Diagnosis not present

## 2015-04-03 DIAGNOSIS — M150 Primary generalized (osteo)arthritis: Secondary | ICD-10-CM | POA: Diagnosis not present

## 2015-04-03 DIAGNOSIS — I10 Essential (primary) hypertension: Secondary | ICD-10-CM | POA: Diagnosis not present

## 2015-04-03 DIAGNOSIS — E089 Diabetes mellitus due to underlying condition without complications: Secondary | ICD-10-CM | POA: Diagnosis not present

## 2015-04-03 DIAGNOSIS — E782 Mixed hyperlipidemia: Secondary | ICD-10-CM | POA: Diagnosis not present

## 2015-08-01 DIAGNOSIS — E782 Mixed hyperlipidemia: Secondary | ICD-10-CM | POA: Diagnosis not present

## 2015-08-01 DIAGNOSIS — I10 Essential (primary) hypertension: Secondary | ICD-10-CM | POA: Diagnosis not present

## 2015-08-01 DIAGNOSIS — M150 Primary generalized (osteo)arthritis: Secondary | ICD-10-CM | POA: Diagnosis not present

## 2015-08-01 DIAGNOSIS — E089 Diabetes mellitus due to underlying condition without complications: Secondary | ICD-10-CM | POA: Diagnosis not present

## 2015-08-23 DIAGNOSIS — I10 Essential (primary) hypertension: Secondary | ICD-10-CM | POA: Diagnosis not present

## 2015-08-23 DIAGNOSIS — E782 Mixed hyperlipidemia: Secondary | ICD-10-CM | POA: Diagnosis not present

## 2015-08-23 DIAGNOSIS — M150 Primary generalized (osteo)arthritis: Secondary | ICD-10-CM | POA: Diagnosis not present

## 2015-08-23 DIAGNOSIS — E089 Diabetes mellitus due to underlying condition without complications: Secondary | ICD-10-CM | POA: Diagnosis not present

## 2015-09-17 DIAGNOSIS — M25561 Pain in right knee: Secondary | ICD-10-CM | POA: Diagnosis not present

## 2015-09-17 DIAGNOSIS — M1711 Unilateral primary osteoarthritis, right knee: Secondary | ICD-10-CM | POA: Diagnosis not present

## 2015-10-08 DIAGNOSIS — M25561 Pain in right knee: Secondary | ICD-10-CM | POA: Diagnosis not present

## 2015-10-24 DIAGNOSIS — Z0000 Encounter for general adult medical examination without abnormal findings: Secondary | ICD-10-CM | POA: Diagnosis not present

## 2015-10-24 DIAGNOSIS — I10 Essential (primary) hypertension: Secondary | ICD-10-CM | POA: Diagnosis not present

## 2015-10-24 DIAGNOSIS — M150 Primary generalized (osteo)arthritis: Secondary | ICD-10-CM | POA: Diagnosis not present

## 2015-11-30 LAB — GLUCOSE, POCT (MANUAL RESULT ENTRY): POC GLUCOSE: 132 mg/dL — AB (ref 70–99)

## 2015-12-18 DIAGNOSIS — H00016 Hordeolum externum left eye, unspecified eyelid: Secondary | ICD-10-CM | POA: Diagnosis not present

## 2015-12-18 DIAGNOSIS — H401132 Primary open-angle glaucoma, bilateral, moderate stage: Secondary | ICD-10-CM | POA: Diagnosis not present

## 2016-02-20 DIAGNOSIS — E119 Type 2 diabetes mellitus without complications: Secondary | ICD-10-CM | POA: Diagnosis not present

## 2016-02-20 DIAGNOSIS — I10 Essential (primary) hypertension: Secondary | ICD-10-CM | POA: Diagnosis not present

## 2016-02-20 DIAGNOSIS — E782 Mixed hyperlipidemia: Secondary | ICD-10-CM | POA: Diagnosis not present

## 2016-02-25 DIAGNOSIS — M150 Primary generalized (osteo)arthritis: Secondary | ICD-10-CM | POA: Diagnosis not present

## 2016-02-25 DIAGNOSIS — I10 Essential (primary) hypertension: Secondary | ICD-10-CM | POA: Diagnosis not present

## 2016-02-25 DIAGNOSIS — E782 Mixed hyperlipidemia: Secondary | ICD-10-CM | POA: Diagnosis not present

## 2016-02-26 DIAGNOSIS — H401132 Primary open-angle glaucoma, bilateral, moderate stage: Secondary | ICD-10-CM | POA: Diagnosis not present

## 2016-02-26 DIAGNOSIS — H00016 Hordeolum externum left eye, unspecified eyelid: Secondary | ICD-10-CM | POA: Diagnosis not present

## 2016-03-26 DIAGNOSIS — I10 Essential (primary) hypertension: Secondary | ICD-10-CM | POA: Diagnosis not present

## 2016-05-13 ENCOUNTER — Other Ambulatory Visit: Payer: Self-pay | Admitting: Family Medicine

## 2016-05-13 LAB — URINE CYTOLOGY ANCILLARY ONLY

## 2016-05-26 DIAGNOSIS — E782 Mixed hyperlipidemia: Secondary | ICD-10-CM | POA: Diagnosis not present

## 2016-05-26 DIAGNOSIS — E089 Diabetes mellitus due to underlying condition without complications: Secondary | ICD-10-CM | POA: Diagnosis not present

## 2016-05-26 DIAGNOSIS — M150 Primary generalized (osteo)arthritis: Secondary | ICD-10-CM | POA: Diagnosis not present

## 2016-05-26 DIAGNOSIS — I10 Essential (primary) hypertension: Secondary | ICD-10-CM | POA: Diagnosis not present

## 2016-07-22 DIAGNOSIS — H1010 Acute atopic conjunctivitis, unspecified eye: Secondary | ICD-10-CM | POA: Diagnosis not present

## 2016-07-22 DIAGNOSIS — H401132 Primary open-angle glaucoma, bilateral, moderate stage: Secondary | ICD-10-CM | POA: Diagnosis not present

## 2016-07-22 DIAGNOSIS — H16223 Keratoconjunctivitis sicca, not specified as Sjogren's, bilateral: Secondary | ICD-10-CM | POA: Diagnosis not present

## 2016-10-10 DIAGNOSIS — I10 Essential (primary) hypertension: Secondary | ICD-10-CM | POA: Diagnosis not present

## 2016-10-10 DIAGNOSIS — E089 Diabetes mellitus due to underlying condition without complications: Secondary | ICD-10-CM | POA: Diagnosis not present

## 2016-10-10 DIAGNOSIS — M150 Primary generalized (osteo)arthritis: Secondary | ICD-10-CM | POA: Diagnosis not present

## 2016-10-10 DIAGNOSIS — E782 Mixed hyperlipidemia: Secondary | ICD-10-CM | POA: Diagnosis not present

## 2016-11-12 DIAGNOSIS — E089 Diabetes mellitus due to underlying condition without complications: Secondary | ICD-10-CM | POA: Diagnosis not present

## 2016-11-12 DIAGNOSIS — Z0000 Encounter for general adult medical examination without abnormal findings: Secondary | ICD-10-CM | POA: Diagnosis not present

## 2016-11-12 DIAGNOSIS — E782 Mixed hyperlipidemia: Secondary | ICD-10-CM | POA: Diagnosis not present

## 2016-11-12 DIAGNOSIS — M150 Primary generalized (osteo)arthritis: Secondary | ICD-10-CM | POA: Diagnosis not present

## 2016-11-12 DIAGNOSIS — I10 Essential (primary) hypertension: Secondary | ICD-10-CM | POA: Diagnosis not present

## 2017-02-20 DIAGNOSIS — M1711 Unilateral primary osteoarthritis, right knee: Secondary | ICD-10-CM | POA: Diagnosis not present

## 2017-02-20 DIAGNOSIS — M25562 Pain in left knee: Secondary | ICD-10-CM | POA: Diagnosis not present

## 2017-02-20 DIAGNOSIS — M25561 Pain in right knee: Secondary | ICD-10-CM | POA: Diagnosis not present

## 2017-02-20 DIAGNOSIS — M1712 Unilateral primary osteoarthritis, left knee: Secondary | ICD-10-CM | POA: Diagnosis not present

## 2017-02-27 DIAGNOSIS — H401132 Primary open-angle glaucoma, bilateral, moderate stage: Secondary | ICD-10-CM | POA: Diagnosis not present

## 2017-03-13 DIAGNOSIS — H401132 Primary open-angle glaucoma, bilateral, moderate stage: Secondary | ICD-10-CM | POA: Diagnosis not present

## 2017-03-20 DIAGNOSIS — E782 Mixed hyperlipidemia: Secondary | ICD-10-CM | POA: Diagnosis not present

## 2017-03-20 DIAGNOSIS — E089 Diabetes mellitus due to underlying condition without complications: Secondary | ICD-10-CM | POA: Diagnosis not present

## 2017-03-20 DIAGNOSIS — I10 Essential (primary) hypertension: Secondary | ICD-10-CM | POA: Diagnosis not present

## 2017-03-25 DIAGNOSIS — Z1231 Encounter for screening mammogram for malignant neoplasm of breast: Secondary | ICD-10-CM | POA: Diagnosis not present

## 2017-06-08 DIAGNOSIS — I10 Essential (primary) hypertension: Secondary | ICD-10-CM | POA: Diagnosis not present

## 2017-06-08 DIAGNOSIS — N3281 Overactive bladder: Secondary | ICD-10-CM | POA: Diagnosis not present

## 2017-06-08 DIAGNOSIS — E089 Diabetes mellitus due to underlying condition without complications: Secondary | ICD-10-CM | POA: Diagnosis not present

## 2017-06-10 DIAGNOSIS — H401132 Primary open-angle glaucoma, bilateral, moderate stage: Secondary | ICD-10-CM | POA: Diagnosis not present

## 2017-07-13 DIAGNOSIS — M1711 Unilateral primary osteoarthritis, right knee: Secondary | ICD-10-CM | POA: Diagnosis not present

## 2017-07-20 DIAGNOSIS — E782 Mixed hyperlipidemia: Secondary | ICD-10-CM | POA: Diagnosis not present

## 2017-07-20 DIAGNOSIS — I10 Essential (primary) hypertension: Secondary | ICD-10-CM | POA: Diagnosis not present

## 2017-07-20 DIAGNOSIS — E089 Diabetes mellitus due to underlying condition without complications: Secondary | ICD-10-CM | POA: Diagnosis not present

## 2017-07-20 DIAGNOSIS — E039 Hypothyroidism, unspecified: Secondary | ICD-10-CM | POA: Diagnosis not present

## 2017-07-20 DIAGNOSIS — M150 Primary generalized (osteo)arthritis: Secondary | ICD-10-CM | POA: Diagnosis not present

## 2017-07-20 DIAGNOSIS — E11618 Type 2 diabetes mellitus with other diabetic arthropathy: Secondary | ICD-10-CM | POA: Diagnosis not present

## 2017-07-28 DIAGNOSIS — M1711 Unilateral primary osteoarthritis, right knee: Secondary | ICD-10-CM | POA: Diagnosis not present

## 2017-08-03 ENCOUNTER — Ambulatory Visit: Payer: Self-pay | Admitting: Physician Assistant

## 2017-08-03 NOTE — H&P (View-Only) (Signed)
TOTAL KNEE ADMISSION H&P  Patient is being admitted for right total knee arthroplasty.  Subjective:  Chief Complaint:right knee pain.  HPI: Kelly Golden, 77 y.o. female, has a history of pain and functional disability in the right knee due to arthritis and has failed non-surgical conservative treatments for greater than 12 weeks to includeNSAID's and/or analgesics, corticosteriod injections, use of assistive devices and activity modification.  Onset of symptoms was gradual, starting >10 years ago with gradually worsening course since that time. The patient noted no past surgery on the right knee(s).  Patient currently rates pain in the right knee(s) at 10 out of 10 with activity. Patient has night pain, worsening of pain with activity and weight bearing, pain that interferes with activities of daily living, pain with passive range of motion, crepitus and joint swelling.  Patient has evidence of periarticular osteophytes and joint space narrowing by imaging studies.There is no active infection.  There are no active problems to display for this patient.  Past Medical History:  Diagnosis Date  . Diabetes mellitus without complication    on no meds, diet control  . Hypertension   . Seasonal allergies   . Seizures    as a young child    Past Surgical History:  Procedure Laterality Date  . CATARACT EXTRACTION W/PHACO Right 06/30/2012   Procedure: CATARACT EXTRACTION PHACO AND INTRAOCULAR LENS PLACEMENT (IOC);  Surgeon: Greer Geiger, MD;  Location: [facility] OR;  Service: Ophthalmology;  Laterality: Right;  . EYE SURGERY Left    cataract IOL  . TUBAL LIGATION      Current Outpatient Medications  Medication Sig Dispense Refill Last Dose  . acetaminophen (TYLENOL) 500 MG tablet Take 1,000 mg by mouth daily as needed for moderate pain.     . brimonidine-timolol (COMBIGAN) 0.2-0.5 % ophthalmic solution Place 1 drop into both eyes 2 (two) times daily.     . CARTIA XT 240 MG 24 hr capsule Take  240 mg by mouth daily.  0   . fexofenadine (ALLEGRA) 180 MG tablet Take 180 mg by mouth daily as needed for allergies or rhinitis.     . mirabegron ER (MYRBETRIQ) 25 MG TB24 tablet Take 25 mg by mouth daily.     . spironolactone (ALDACTONE) 25 MG tablet Take 25 mg by mouth 2 (two) times daily.   06/29/2012 at Unknown   No current facility-administered medications for this visit.    No Known Allergies  Social History   Tobacco Use  . Smoking status: Former Smoker    Packs/day: 1.00    Years: 20.00    Pack years: 20.00    Types: Cigarettes    Last attempt to quit: 06/24/1997    Years since quitting: 20.1  . Smokeless tobacco: Never Used  Substance Use Topics  . Alcohol use: Yes    Comment: social    No family history on file.   Review of Systems  Cardiovascular: Positive for leg swelling.  Genitourinary: Positive for frequency.  Musculoskeletal: Positive for joint pain.  All other systems reviewed and are negative.   Objective:  Physical Exam  Constitutional: She is oriented to person, place, and time. She appears well-developed and well-nourished. No distress.  HENT:  Head: Normocephalic and atraumatic.  Nose: Nose normal.  Eyes: Pupils are equal, round, and reactive to light. Conjunctivae and EOM are normal.  Neck: Normal range of motion. Neck supple.  Cardiovascular: Regular rhythm, normal heart sounds and intact distal pulses. Tachycardia present.  Respiratory: Effort normal   and breath sounds normal. No respiratory distress. She has no wheezes.  GI: Soft. Bowel sounds are normal. She exhibits no distension. There is no tenderness.  Musculoskeletal:       Right knee: She exhibits decreased range of motion and swelling. She exhibits no erythema. Tenderness found. Medial joint line and lateral joint line tenderness noted.  Lymphadenopathy:    She has no cervical adenopathy.  Neurological: She is alert and oriented to person, place, and time. No cranial nerve deficit.   Skin: Skin is warm and dry. No rash noted. No erythema.  Psychiatric: She has a normal mood and affect. Her behavior is normal.    Vital signs in last 24 hours: @  Labs:   Estimated body mass index is 34.84 kg/m as calculated from the following:   Height as of 06/24/12:  (1.549 m).   Weight as of 06/24/12: 83.6 kg (184 lb 6.4 oz).   Imaging Review Plain radiographs demonstrate severe degenerative joint disease of the right knee(s). The overall alignment issignificant varus. The bone quality appears to be good for age and reported activity level.   Preoperative templating of the joint replacement has been completed, documented, and submitted to the Operating Room personnel in order to optimize intra-operative equipment management.    Patient's anticipated LOS is less than 2 midnights, meeting these requirements: - Younger than 82 - Lives within 1 hour of care - Has a competent adult at home to recover with post-op recover - NO history of  - Chronic pain requiring opiods  - Diabetes  - Coronary Artery Disease  - Heart failure  - Heart attack  - Stroke  - DVT/VTE  - Cardiac arrhythmia  - Respiratory Failure/COPD  - Renal failure  - Anemia  - Advanced Liver disease        Assessment/Plan:  End stage arthritis, right knee   The patient history, physical examination, clinical judgment of the provider and imaging studies are consistent with end stage degenerative joint disease of the right knee(s) and total knee arthroplasty is deemed medically necessary. The treatment options including medical management, injection therapy arthroscopy and arthroplasty were discussed at length. The risks and benefits of total knee arthroplasty were presented and reviewed. The risks due to aseptic loosening, infection, stiffness, patella tracking problems, thromboembolic complications and other imponderables were discussed. The patient acknowledged the explanation, agreed to  proceed with the plan and consent was signed. Patient is being admitted for inpatient treatment for surgery, pain control, PT, OT, prophylactic antibiotics, VTE prophylaxis, progressive ambulation and ADL's and discharge planning. The patient is planning to be discharged home with home health services

## 2017-08-03 NOTE — H&P (Signed)
TOTAL KNEE ADMISSION H&P  Patient is being admitted for right total knee arthroplasty.  Subjective:  Chief Complaint:right knee pain.  HPI: Kelly Golden, 77 y.o. female, has a history of pain and functional disability in the right knee due to arthritis and has failed non-surgical conservative treatments for greater than 12 weeks to includeNSAID's and/or analgesics, corticosteriod injections, use of assistive devices and activity modification.  Onset of symptoms was gradual, starting >10 years ago with gradually worsening course since that time. The patient noted no past surgery on the right knee(s).  Patient currently rates pain in the right knee(s) at 10 out of 10 with activity. Patient has night pain, worsening of pain with activity and weight bearing, pain that interferes with activities of daily living, pain with passive range of motion, crepitus and joint swelling.  Patient has evidence of periarticular osteophytes and joint space narrowing by imaging studies.There is no active infection.  There are no active problems to display for this patient.  Past Medical History:  Diagnosis Date  . Diabetes mellitus without complication    on no meds, diet control  . Hypertension   . Seasonal allergies   . Seizures    as a young child    Past Surgical History:  Procedure Laterality Date  . CATARACT EXTRACTION W/PHACO Right 06/30/2012   Procedure: CATARACT EXTRACTION PHACO AND INTRAOCULAR LENS PLACEMENT (IOC);  Surgeon: Shade Flood, MD;  Location: [facility];  Service: Ophthalmology;  Laterality: Right;  . EYE SURGERY Left    cataract IOL  . TUBAL LIGATION      Current Outpatient Medications  Medication Sig Dispense Refill Last Dose  . acetaminophen (TYLENOL) 500 MG tablet Take 1,000 mg by mouth daily as needed for moderate pain.     . brimonidine-timolol (COMBIGAN) 0.2-0.5 % ophthalmic solution Place 1 drop into both eyes 2 (two) times daily.     Marland Kitchen CARTIA XT 240 MG 24 hr capsule Take  240 mg by mouth daily.  0   . fexofenadine (ALLEGRA) 180 MG tablet Take 180 mg by mouth daily as needed for allergies or rhinitis.     Marland Kitchen mirabegron ER (MYRBETRIQ) 25 MG TB24 tablet Take 25 mg by mouth daily.     Marland Kitchen spironolactone (ALDACTONE) 25 MG tablet Take 25 mg by mouth 2 (two) times daily.   06/29/2012 at Unknown   No current facility-administered medications for this visit.    No Known Allergies  Social History   Tobacco Use  . Smoking status: Former Smoker    Packs/day: 1.00    Years: 20.00    Pack years: 20.00    Types: Cigarettes    Last attempt to quit: 06/24/1997    Years since quitting: 20.1  . Smokeless tobacco: Never Used  Substance Use Topics  . Alcohol use: Yes    Comment: social    No family history on file.   Review of Systems  Cardiovascular: Positive for leg swelling.  Genitourinary: Positive for frequency.  Musculoskeletal: Positive for joint pain.  All other systems reviewed and are negative.   Objective:  Physical Exam  Constitutional: She is oriented to person, place, and time. She appears well-developed and well-nourished. No distress.  HENT:  Head: Normocephalic and atraumatic.  Nose: Nose normal.  Eyes: Pupils are equal, round, and reactive to light. Conjunctivae and EOM are normal.  Neck: Normal range of motion. Neck supple.  Cardiovascular: Regular rhythm, normal heart sounds and intact distal pulses. Tachycardia present.  Respiratory: Effort normal  and breath sounds normal. No respiratory distress. She has no wheezes.  GI: Soft. Bowel sounds are normal. She exhibits no distension. There is no tenderness.  Musculoskeletal:       Right knee: She exhibits decreased range of motion and swelling. She exhibits no erythema. Tenderness found. Medial joint line and lateral joint line tenderness noted.  Lymphadenopathy:    She has no cervical adenopathy.  Neurological: She is alert and oriented to person, place, and time. No cranial nerve deficit.   Skin: Skin is warm and dry. No rash noted. No erythema.  Psychiatric: She has a normal mood and affect. Her behavior is normal.    Vital signs in last 24 hours: @  Labs:   Estimated body mass index is 34.84 kg/m as calculated from the following:   Height as of 06/24/12:  (1.549 m).   Weight as of 06/24/12: 83.6 kg (184 lb 6.4 oz).   Imaging Review Plain radiographs demonstrate severe degenerative joint disease of the right knee(s). The overall alignment issignificant varus. The bone quality appears to be good for age and reported activity level.   Preoperative templating of the joint replacement has been completed, documented, and submitted to the Operating Room personnel in order to optimize intra-operative equipment management.    Patient's anticipated LOS is less than 2 midnights, meeting these requirements: - Younger than 82 - Lives within 1 hour of care - Has a competent adult at home to recover with post-op recover - NO history of  - Chronic pain requiring opiods  - Diabetes  - Coronary Artery Disease  - Heart failure  - Heart attack  - Stroke  - DVT/VTE  - Cardiac arrhythmia  - Respiratory Failure/COPD  - Renal failure  - Anemia  - Advanced Liver disease        Assessment/Plan:  End stage arthritis, right knee   The patient history, physical examination, clinical judgment of the provider and imaging studies are consistent with end stage degenerative joint disease of the right knee(s) and total knee arthroplasty is deemed medically necessary. The treatment options including medical management, injection therapy arthroscopy and arthroplasty were discussed at length. The risks and benefits of total knee arthroplasty were presented and reviewed. The risks due to aseptic loosening, infection, stiffness, patella tracking problems, thromboembolic complications and other imponderables were discussed. The patient acknowledged the explanation, agreed to  proceed with the plan and consent was signed. Patient is being admitted for inpatient treatment for surgery, pain control, PT, OT, prophylactic antibiotics, VTE prophylaxis, progressive ambulation and ADL's and discharge planning. The patient is planning to be discharged home with home health services

## 2017-08-07 NOTE — Pre-Procedure Instructions (Signed)
Kelly Golden [facility]  08/07/2017      [facility] [place], [place] - [address] [place] [address] Phone: [phone] Fax: [phone]    Your procedure is scheduled on August 21, 2017.  Report to [facility] Admitting at 0530 AM.  Call this number if you have problems the morning of surgery:  [phone]   Remember:  No food or drink after midnight.  Continue all medications as directed by your physician except follow these medication instructions before surgery below    Take these medicines the morning of surgery with A SIP OF WATER  Tylenol-if needed Eye drops cartia XT Myrbetriq Allegra  7 days prior to surgery STOP taking any Aspirin (unless otherwise instructed by your surgeon), Aleve, Naproxen, Ibuprofen, Motrin, Advil, Goody's, BC's, all herbal medications, fish oil, and all vitamins    Do not wear jewelry, make-up or nail polish.  Do not wear lotions, powders, or perfumes, or deodorant.  Do not shave 48 hours prior to surgery.    Do not bring valuables to the hospital.  [facility] is not responsible for any belongings or valuables.  Contacts, dentures or bridgework may not be worn into surgery.  Leave your suitcase in the car.  After surgery it may be brought to your room.  For patients admitted to the hospital, discharge time will be determined by your treatment team.  Patients discharged the day of surgery will not be allowed to drive home.   [facility]- Preparing For Surgery  Before surgery, you can play an important role. Because skin is not sterile, your skin needs to be as free of germs as possible. You can reduce the number of germs on your skin by washing with CHG (chlorahexidine gluconate) Soap before surgery.  CHG is an antiseptic cleaner which kills germs and bonds with the skin to continue killing germs even after washing.    Oral Hygiene is also important to reduce your risk of  infection.  Remember - BRUSH YOUR TEETH THE MORNING OF SURGERY WITH YOUR REGULAR TOOTHPASTE  Please do not use if you have an allergy to CHG or antibacterial soaps. If your skin becomes reddened/irritated stop using the CHG.  Do not shave (including legs and underarms) for at least 48 hours prior to first CHG shower. It is OK to shave your face.  Please follow these instructions carefully.   1. Shower the NIGHT BEFORE SURGERY and the MORNING OF SURGERY with CHG.   2. If you chose to wash your hair, wash your hair first as usual with your normal shampoo.  3. After you shampoo, rinse your hair and body thoroughly to remove the shampoo.  4. Use CHG as you would any other liquid soap. You can apply CHG directly to the skin and wash gently with a scrungie or a clean washcloth.   5. Apply the CHG Soap to your body ONLY FROM THE NECK DOWN.  Do not use on open wounds or open sores. Avoid contact with your eyes, ears, mouth and genitals (private parts). Wash Face and genitals (private parts)  with your normal soap.  6. Wash thoroughly, paying special attention to the area where your surgery will be performed.  7. Thoroughly rinse your body with warm water from the neck down.  8. DO NOT shower/wash with your normal soap after using and rinsing off the CHG Soap.  9. Pat yourself dry with a CLEAN TOWEL.  10.  Wear CLEAN PAJAMAS to bed the night before surgery, wear comfortable clothes the morning of surgery  11. Place CLEAN SHEETS on your bed the night of your first shower and DO NOT SLEEP WITH PETS.  Day of Surgery:  Do not apply any deodorants/lotions.  Please wear clean clothes to the hospital/surgery center.   Remember to brush your teeth WITH YOUR REGULAR TOOTHPASTE.   Please read over the following fact sheets that you were given. Pain Booklet, Coughing and Deep Breathing, MRSA Information and Surgical Site Infection Prevention

## 2017-08-11 ENCOUNTER — Encounter (HOSPITAL_COMMUNITY)
Admission: RE | Admit: 2017-08-11 | Discharge: 2017-08-11 | Disposition: A | Discharge status: Home / Self Care | Payer: Medicare Other | Source: Ambulatory Visit | Attending: Physician Assistant | Admitting: Physician Assistant

## 2017-08-11 ENCOUNTER — Encounter (HOSPITAL_COMMUNITY): Payer: Self-pay

## 2017-08-11 ENCOUNTER — Other Ambulatory Visit: Payer: Self-pay

## 2017-08-11 ENCOUNTER — Encounter (HOSPITAL_COMMUNITY)
Admission: RE | Admit: 2017-08-11 | Discharge: 2017-08-11 | Disposition: A | Discharge status: Home / Self Care | Payer: Medicare Other | Source: Ambulatory Visit | Attending: Orthopedic Surgery | Admitting: Orthopedic Surgery

## 2017-08-11 DIAGNOSIS — Z01810 Encounter for preprocedural cardiovascular examination: Secondary | ICD-10-CM | POA: Insufficient documentation

## 2017-08-11 DIAGNOSIS — M1711 Unilateral primary osteoarthritis, right knee: Secondary | ICD-10-CM | POA: Diagnosis not present

## 2017-08-11 DIAGNOSIS — Z01818 Encounter for other preprocedural examination: Secondary | ICD-10-CM | POA: Diagnosis not present

## 2017-08-11 DIAGNOSIS — Z96659 Presence of unspecified artificial knee joint: Secondary | ICD-10-CM | POA: Diagnosis not present

## 2017-08-11 DIAGNOSIS — Z01812 Encounter for preprocedural laboratory examination: Secondary | ICD-10-CM | POA: Insufficient documentation

## 2017-08-11 DIAGNOSIS — Z471 Aftercare following joint replacement surgery: Secondary | ICD-10-CM | POA: Diagnosis not present

## 2017-08-11 HISTORY — DX: Unspecified cataract: H26.9

## 2017-08-11 HISTORY — DX: Unspecified osteoarthritis, unspecified site: M19.90

## 2017-08-11 HISTORY — DX: Unspecified urinary incontinence: R32

## 2017-08-11 LAB — URINALYSIS, ROUTINE W REFLEX MICROSCOPIC
BILIRUBIN URINE: NEGATIVE
Glucose, UA: NEGATIVE mg/dL
Hgb urine dipstick: NEGATIVE
Ketones, ur: 15 mg/dL — AB
Leukocytes, UA: NEGATIVE
NITRITE: POSITIVE — AB
Protein, ur: NEGATIVE mg/dL
Specific Gravity, Urine: >1.03 — ABNORMAL HIGH (ref 1.005–1.030)
pH: 5.5 (ref 5.0–8.0)

## 2017-08-11 LAB — CBC WITH DIFFERENTIAL/PLATELET
Abs Immature Granulocytes: 0 10*3/uL (ref 0.0–0.1)
Basophils Absolute: 0 10*3/uL (ref 0.0–0.1)
Basophils Relative: 0 %
EOS PCT: 2 %
Eosinophils Absolute: 0.2 10*3/uL (ref 0.0–0.7)
HEMATOCRIT: 44 % (ref 36.0–46.0)
HEMOGLOBIN: 13.8 g/dL (ref 12.0–15.0)
IMMATURE GRANULOCYTES: 0 %
LYMPHS ABS: 2.2 10*3/uL (ref 0.7–4.0)
LYMPHS PCT: 26 %
MCH: 24.9 pg — ABNORMAL LOW (ref 26.0–34.0)
MCHC: 31.4 g/dL (ref 30.0–36.0)
MCV: 79.3 fL (ref 78.0–100.0)
MONOS PCT: 10 %
Monocytes Absolute: 0.8 10*3/uL (ref 0.1–1.0)
Neutro Abs: 5.1 10*3/uL (ref 1.7–7.7)
Neutrophils Relative %: 62 %
Platelets: 298 10*3/uL (ref 150–400)
RBC: 5.55 MIL/uL — ABNORMAL HIGH (ref 3.87–5.11)
RDW: 14.6 % (ref 11.5–15.5)
WBC: 8.3 10*3/uL (ref 4.0–10.5)

## 2017-08-11 LAB — URINALYSIS, MICROSCOPIC (REFLEX): RBC / HPF: NONE SEEN RBC/hpf (ref 0–5)

## 2017-08-11 LAB — PROTIME-INR
INR: 0.97
Prothrombin Time: 12.7 seconds (ref 11.4–15.2)

## 2017-08-11 LAB — TYPE AND SCREEN
ABO/RH(D): A POS
Antibody Screen: NEGATIVE

## 2017-08-11 LAB — COMPREHENSIVE METABOLIC PANEL
ALBUMIN: 3.9 g/dL (ref 3.5–5.0)
ALK PHOS: 73 U/L (ref 38–126)
ALT: 16 U/L (ref 14–54)
AST: 18 U/L (ref 15–41)
Anion gap: 9 (ref 5–15)
BILIRUBIN TOTAL: 0.6 mg/dL (ref 0.3–1.2)
BUN: 14 mg/dL (ref 6–20)
CALCIUM: 9.6 mg/dL (ref 8.9–10.3)
CO2: 25 mmol/L (ref 22–32)
CREATININE: 0.81 mg/dL (ref 0.44–1.00)
Chloride: 106 mmol/L (ref 101–111)
GFR calc Af Amer: >60 mL/min (ref >60)
GFR calc non Af Amer: >60 mL/min (ref >60)
GLUCOSE: 129 mg/dL — AB (ref 65–99)
Potassium: 4.1 mmol/L (ref 3.5–5.1)
Sodium: 140 mmol/L (ref 135–145)
Total Protein: 7.4 g/dL (ref 6.5–8.1)

## 2017-08-11 LAB — SURGICAL PCR SCREEN
MRSA, PCR: NEGATIVE
STAPHYLOCOCCUS AUREUS: POSITIVE — AB

## 2017-08-11 LAB — HEMOGLOBIN A1C
Hgb A1c MFr Bld: 6.4 % — ABNORMAL HIGH (ref 4.8–5.6)
Mean Plasma Glucose: 136.98 mg/dL

## 2017-08-11 LAB — APTT: aPTT: 30 seconds (ref 24–36)

## 2017-08-11 LAB — ABO/RH: ABO/RH(D): A POS

## 2017-08-11 LAB — GLUCOSE, CAPILLARY: GLUCOSE-CAPILLARY: 117 mg/dL — AB (ref 65–99)

## 2017-08-11 NOTE — Progress Notes (Signed)
PCP is Dr. Renaye Rakers.  Patient states she believes she just saw her back in 04/2017 Patient denies murmur, cp, sob . Blood sugar checks are twice a week. Not sure when her last A1C was. Kelly Golden believes she had stress test & sleep apnea study done more than 5 yrs ago at the [facility]. I have called to get info to verify this.  Office states they don't do those tests there.  I have placed a call to the nurse.

## 2017-08-11 NOTE — Progress Notes (Signed)
PCP is Dr. Renaye Rakers.  Patient states she believes she just saw her back in 04/2017 Patient denies murmur, cp, sob . Blood sugar checks are twice a week. Not sure when her last A1C was. Kelly Golden believes she had stress test & sleep apnea study done more than 5 yrs ago at the [facility]. I have called to get info to verify this.  Office states they don't do those tests there.  I have placed a call to the nurse.   Spoke with Josh at office concerning patients urine sample.

## 2017-08-13 LAB — URINE CULTURE

## 2017-08-20 MED ORDER — TRANEXAMIC ACID 1000 MG/10ML IV SOLN
2000.0000 mg | INTRAVENOUS | Status: AC
  Administered 2017-08-21: 2000 mg via TOPICAL
  Filled 2017-08-20: qty 20

## 2017-08-20 MED ORDER — CEFAZOLIN SODIUM-DEXTROSE 2-4 GM/100ML-% IV SOLN
2.0000 g | INTRAVENOUS | Status: AC
  Administered 2017-08-21: 2 g via INTRAVENOUS
  Filled 2017-08-20: qty 100

## 2017-08-20 MED ORDER — SODIUM CHLORIDE 0.9 % IV SOLN: INTRAVENOUS | Status: DC

## 2017-08-20 MED ORDER — TRANEXAMIC ACID 1000 MG/10ML IV SOLN
1000.0000 mg | INTRAVENOUS | Status: DC
  Filled 2017-08-20: qty 10

## 2017-08-20 MED ORDER — BUPIVACAINE LIPOSOME 1.3 % IJ SUSP
20.0000 mL | INTRAMUSCULAR | Status: DC
  Filled 2017-08-20: qty 20

## 2017-08-20 NOTE — Anesthesia Preprocedure Evaluation (Addendum)
Anesthesia Evaluation  Patient identified by MRN, date of birth, ID band Patient awake    Reviewed: Allergy & Precautions, H&P , NPO status , Patient's Chart, lab work & pertinent test results  Airway Mallampati: II   Neck ROM: full    Dental  (+) Edentulous Upper, Edentulous Lower   Pulmonary former smoker,    Pulmonary exam normal        Cardiovascular hypertension, Normal cardiovascular exam Rhythm:Regular Rate:Normal     Neuro/Psych Seizures -,     GI/Hepatic   Endo/Other  diabetes, Type 2obese  Renal/GU      Musculoskeletal  (+) Arthritis , Osteoarthritis,    Abdominal   Peds  Hematology   Anesthesia Other Findings   Reproductive/Obstetrics                            Anesthesia Physical  Anesthesia Plan  ASA: III  Anesthesia Plan: Spinal   Post-op Pain Management:  Regional for Post-op pain   Induction: Intravenous  PONV Risk Score and Plan: 2 and Treatment may vary due to age or medical condition  Airway Management Planned: Simple Face Mask, Nasal Cannula and Natural Airway  Additional Equipment:   Intra-op Plan:   Post-operative Plan:   Informed Consent: I have reviewed the patients History and Physical, chart, labs and discussed the procedure including the risks, benefits and alternatives for the proposed anesthesia with the patient or authorized representative who has indicated his/her understanding and acceptance.     Plan Discussed with: CRNA, Surgeon and Anesthesiologist  Anesthesia Plan Comments: ( )        Anesthesia Quick Evaluation

## 2017-08-21 ENCOUNTER — Ambulatory Visit (HOSPITAL_COMMUNITY): Payer: Medicare Other | Admitting: Anesthesiology

## 2017-08-21 ENCOUNTER — Encounter (HOSPITAL_COMMUNITY): Payer: Self-pay | Admitting: Certified Registered"

## 2017-08-21 ENCOUNTER — Observation Stay (HOSPITAL_COMMUNITY)
Admission: RE | Admit: 2017-08-21 | Discharge: 2017-08-23 | Disposition: A | Discharge status: Home / Self Care | Payer: Medicare Other | Source: Ambulatory Visit | Attending: Orthopedic Surgery | Admitting: Orthopedic Surgery

## 2017-08-21 ENCOUNTER — Encounter (HOSPITAL_COMMUNITY)
Admission: RE | Disposition: A | Discharge status: Home / Self Care | Payer: Self-pay | Source: Ambulatory Visit | Attending: Orthopedic Surgery

## 2017-08-21 ENCOUNTER — Ambulatory Visit (HOSPITAL_COMMUNITY): Payer: Medicare Other | Admitting: Emergency Medicine

## 2017-08-21 DIAGNOSIS — I10 Essential (primary) hypertension: Secondary | ICD-10-CM | POA: Diagnosis not present

## 2017-08-21 DIAGNOSIS — Z79899 Other long term (current) drug therapy: Secondary | ICD-10-CM | POA: Diagnosis not present

## 2017-08-21 DIAGNOSIS — M1711 Unilateral primary osteoarthritis, right knee: Principal | ICD-10-CM | POA: Insufficient documentation

## 2017-08-21 DIAGNOSIS — M24561 Contracture, right knee: Secondary | ICD-10-CM | POA: Diagnosis not present

## 2017-08-21 DIAGNOSIS — Z7982 Long term (current) use of aspirin: Secondary | ICD-10-CM | POA: Diagnosis not present

## 2017-08-21 DIAGNOSIS — M21161 Varus deformity, not elsewhere classified, right knee: Secondary | ICD-10-CM | POA: Diagnosis not present

## 2017-08-21 DIAGNOSIS — E119 Type 2 diabetes mellitus without complications: Secondary | ICD-10-CM | POA: Diagnosis not present

## 2017-08-21 DIAGNOSIS — M62461 Contracture of muscle, right lower leg: Secondary | ICD-10-CM | POA: Diagnosis not present

## 2017-08-21 DIAGNOSIS — Z9841 Cataract extraction status, right eye: Secondary | ICD-10-CM | POA: Diagnosis not present

## 2017-08-21 DIAGNOSIS — G8918 Other acute postprocedural pain: Secondary | ICD-10-CM | POA: Diagnosis not present

## 2017-08-21 DIAGNOSIS — Z87891 Personal history of nicotine dependence: Secondary | ICD-10-CM | POA: Diagnosis not present

## 2017-08-21 HISTORY — PX: TOTAL KNEE ARTHROPLASTY: SHX125

## 2017-08-21 LAB — GLUCOSE, CAPILLARY
GLUCOSE-CAPILLARY: 131 mg/dL — AB (ref 65–99)
GLUCOSE-CAPILLARY: 152 mg/dL — AB (ref 65–99)
Glucose-Capillary: 185 mg/dL — ABNORMAL HIGH (ref 65–99)

## 2017-08-21 SURGERY — ARTHROPLASTY, KNEE, TOTAL
Anesthesia: Regional | Site: Knee | Laterality: Right

## 2017-08-21 MED ORDER — LORATADINE 10 MG PO TABS
10.0000 mg | ORAL_TABLET | Freq: Every day | ORAL | Status: DC
  Administered 2017-08-22 – 2017-08-23 (×2): 10 mg via ORAL
  Filled 2017-08-21 (×2): qty 1

## 2017-08-21 MED ORDER — TRANEXAMIC ACID 1000 MG/10ML IV SOLN
1000.0000 mg | Freq: Once | INTRAVENOUS | Status: AC
  Administered 2017-08-21: 1000 mg via INTRAVENOUS
  Filled 2017-08-21: qty 10

## 2017-08-21 MED ORDER — METOCLOPRAMIDE HCL 5 MG/ML IJ SOLN: 5.0000 mg | Freq: Three times a day (TID) | INTRAMUSCULAR | Status: DC | PRN

## 2017-08-21 MED ORDER — OXYCODONE HCL 5 MG PO TABS
ORAL_TABLET | ORAL | Status: AC
  Administered 2017-08-21: 13:00:00
  Filled 2017-08-21: qty 2

## 2017-08-21 MED ORDER — DOCUSATE SODIUM 100 MG PO CAPS
100.0000 mg | ORAL_CAPSULE | Freq: Two times a day (BID) | ORAL | Status: DC
  Administered 2017-08-21 – 2017-08-23 (×3): 100 mg via ORAL
  Filled 2017-08-21 (×3): qty 1

## 2017-08-21 MED ORDER — BUPIVACAINE-EPINEPHRINE (PF) 0.25% -1:200000 IJ SOLN
INTRAMUSCULAR | Status: DC | PRN
  Administered 2017-08-21: 50 mL

## 2017-08-21 MED ORDER — SODIUM CHLORIDE 0.9 % IV SOLN
INTRAVENOUS | Status: DC | PRN
  Administered 2017-08-21: 1000 mg via INTRAVENOUS

## 2017-08-21 MED ORDER — PHENYLEPHRINE 40 MCG/ML (10ML) SYRINGE FOR IV PUSH (FOR BLOOD PRESSURE SUPPORT)
PREFILLED_SYRINGE | INTRAVENOUS | Status: AC
  Filled 2017-08-21: qty 10

## 2017-08-21 MED ORDER — FLEET ENEMA 7-19 GM/118ML RE ENEM: 1.0000 | ENEMA | Freq: Once | RECTAL | Status: DC | PRN

## 2017-08-21 MED ORDER — TIMOLOL MALEATE 0.5 % OP SOLN
1.0000 [drp] | Freq: Two times a day (BID) | OPHTHALMIC | Status: DC
  Administered 2017-08-21 – 2017-08-23 (×4): 1 [drp] via OPHTHALMIC
  Filled 2017-08-21: qty 5

## 2017-08-21 MED ORDER — BUPIVACAINE-EPINEPHRINE 0.25% -1:200000 IJ SOLN
INTRAMUSCULAR | Status: AC
  Filled 2017-08-21: qty 1

## 2017-08-21 MED ORDER — PHENOL 1.4 % MT LIQD: 1.0000 | OROMUCOSAL | Status: DC | PRN

## 2017-08-21 MED ORDER — BRIMONIDINE TARTRATE 0.15 % OP SOLN
1.0000 [drp] | Freq: Two times a day (BID) | OPHTHALMIC | Status: DC
  Administered 2017-08-21 – 2017-08-23 (×4): 1 [drp] via OPHTHALMIC
  Filled 2017-08-21: qty 5

## 2017-08-21 MED ORDER — HYDROMORPHONE HCL 2 MG/ML IJ SOLN
0.5000 mg | INTRAMUSCULAR | Status: DC | PRN
  Administered 2017-08-22 – 2017-08-23 (×2): 0.5 mg via INTRAVENOUS
  Filled 2017-08-21 (×2): qty 1

## 2017-08-21 MED ORDER — BRIMONIDINE TARTRATE-TIMOLOL 0.2-0.5 % OP SOLN: 1.0000 [drp] | Freq: Two times a day (BID) | OPHTHALMIC | Status: DC

## 2017-08-21 MED ORDER — POLYETHYLENE GLYCOL 3350 17 G PO PACK: 17.0000 g | PACK | Freq: Every day | ORAL | Status: DC | PRN

## 2017-08-21 MED ORDER — CHLORHEXIDINE GLUCONATE 4 % EX LIQD: 60.0000 mL | Freq: Once | CUTANEOUS | Status: DC

## 2017-08-21 MED ORDER — ACETAMINOPHEN 500 MG PO TABS
1000.0000 mg | ORAL_TABLET | Freq: Four times a day (QID) | ORAL | Status: AC
  Administered 2017-08-21 – 2017-08-22 (×4): 1000 mg via ORAL
  Filled 2017-08-21 (×4): qty 2

## 2017-08-21 MED ORDER — DEXAMETHASONE SODIUM PHOSPHATE 10 MG/ML IJ SOLN
INTRAMUSCULAR | Status: AC
  Filled 2017-08-21: qty 1

## 2017-08-21 MED ORDER — LIDOCAINE 2% (20 MG/ML) 5 ML SYRINGE
INTRAMUSCULAR | Status: AC
  Filled 2017-08-21: qty 5

## 2017-08-21 MED ORDER — FENTANYL CITRATE (PF) 250 MCG/5ML IJ SOLN
INTRAMUSCULAR | Status: AC
  Filled 2017-08-21: qty 5

## 2017-08-21 MED ORDER — VANCOMYCIN HCL IN DEXTROSE 1-5 GM/200ML-% IV SOLN
1000.0000 mg | Freq: Once | INTRAVENOUS | Status: AC
  Administered 2017-08-21: 1000 mg via INTRAVENOUS
  Filled 2017-08-21: qty 200

## 2017-08-21 MED ORDER — DILTIAZEM HCL ER COATED BEADS 240 MG PO CP24
240.0000 mg | ORAL_CAPSULE | Freq: Every day | ORAL | Status: DC
  Administered 2017-08-22 – 2017-08-23 (×2): 240 mg via ORAL
  Filled 2017-08-21 (×2): qty 1

## 2017-08-21 MED ORDER — MIDAZOLAM HCL 5 MG/5ML IJ SOLN
INTRAMUSCULAR | Status: DC | PRN
  Administered 2017-08-21: 1 mg via INTRAVENOUS

## 2017-08-21 MED ORDER — PROPOFOL 500 MG/50ML IV EMUL
INTRAVENOUS | Status: DC | PRN
  Administered 2017-08-21: 100 ug/kg/min via INTRAVENOUS

## 2017-08-21 MED ORDER — METOCLOPRAMIDE HCL 5 MG PO TABS: 5.0000 mg | ORAL_TABLET | Freq: Three times a day (TID) | ORAL | Status: DC | PRN

## 2017-08-21 MED ORDER — PROPOFOL 10 MG/ML IV BOLUS
INTRAVENOUS | Status: DC | PRN
  Administered 2017-08-21: 20 mg via INTRAVENOUS

## 2017-08-21 MED ORDER — ACETAMINOPHEN 325 MG PO TABS
325.0000 mg | ORAL_TABLET | Freq: Four times a day (QID) | ORAL | Status: DC | PRN
  Administered 2017-08-21 – 2017-08-23 (×3): 650 mg via ORAL
  Filled 2017-08-21 (×3): qty 2

## 2017-08-21 MED ORDER — FENTANYL CITRATE (PF) 100 MCG/2ML IJ SOLN
INTRAMUSCULAR | Status: AC
  Administered 2017-08-21: 13:00:00
  Filled 2017-08-21: qty 2

## 2017-08-21 MED ORDER — VANCOMYCIN HCL IN DEXTROSE 1-5 GM/200ML-% IV SOLN
1000.0000 mg | Freq: Two times a day (BID) | INTRAVENOUS | Status: AC
  Administered 2017-08-21: 1000 mg via INTRAVENOUS
  Filled 2017-08-21: qty 200

## 2017-08-21 MED ORDER — INSULIN ASPART 100 UNIT/ML ~~LOC~~ SOLN: 4.0000 [IU] | Freq: Three times a day (TID) | SUBCUTANEOUS | Status: DC

## 2017-08-21 MED ORDER — PHENYLEPHRINE 40 MCG/ML (10ML) SYRINGE FOR IV PUSH (FOR BLOOD PRESSURE SUPPORT)
PREFILLED_SYRINGE | INTRAVENOUS | Status: DC | PRN
  Administered 2017-08-21: 40 ug via INTRAVENOUS
  Administered 2017-08-21: 80 ug via INTRAVENOUS
  Administered 2017-08-21: 40 ug via INTRAVENOUS

## 2017-08-21 MED ORDER — ASPIRIN EC 325 MG PO TBEC
325.0000 mg | DELAYED_RELEASE_TABLET | Freq: Every day | ORAL | Status: DC
  Administered 2017-08-22 – 2017-08-23 (×2): 325 mg via ORAL
  Filled 2017-08-21 (×2): qty 1

## 2017-08-21 MED ORDER — ONDANSETRON HCL 4 MG PO TABS: 4.0000 mg | ORAL_TABLET | Freq: Four times a day (QID) | ORAL | Status: DC | PRN

## 2017-08-21 MED ORDER — ASPIRIN EC 325 MG PO TBEC: 325.0000 mg | DELAYED_RELEASE_TABLET | Freq: Every day | ORAL | 0 refills | Status: DC

## 2017-08-21 MED ORDER — MIRABEGRON ER 25 MG PO TB24
25.0000 mg | ORAL_TABLET | Freq: Every day | ORAL | Status: DC
  Administered 2017-08-22 – 2017-08-23 (×2): 25 mg via ORAL
  Filled 2017-08-21 (×2): qty 1

## 2017-08-21 MED ORDER — ROPIVACAINE HCL 7.5 MG/ML IJ SOLN
INTRAMUSCULAR | Status: DC | PRN
  Administered 2017-08-21: 25 mL via PERINEURAL

## 2017-08-21 MED ORDER — LIDOCAINE 2% (20 MG/ML) 5 ML SYRINGE
INTRAMUSCULAR | Status: DC | PRN
  Administered 2017-08-21: 40 mg via INTRAVENOUS

## 2017-08-21 MED ORDER — MENTHOL 3 MG MT LOZG: 1.0000 | LOZENGE | OROMUCOSAL | Status: DC | PRN

## 2017-08-21 MED ORDER — ONDANSETRON HCL 4 MG/2ML IJ SOLN
INTRAMUSCULAR | Status: DC | PRN
  Administered 2017-08-21: 4 mg via INTRAVENOUS

## 2017-08-21 MED ORDER — LACTATED RINGERS IV SOLN
INTRAVENOUS | Status: DC | PRN
  Administered 2017-08-21: 07:00:00 via INTRAVENOUS

## 2017-08-21 MED ORDER — FENTANYL CITRATE (PF) 100 MCG/2ML IJ SOLN
INTRAMUSCULAR | Status: DC | PRN
  Administered 2017-08-21: 50 ug via INTRAVENOUS

## 2017-08-21 MED ORDER — DEXAMETHASONE SODIUM PHOSPHATE 4 MG/ML IJ SOLN
INTRAMUSCULAR | Status: DC | PRN
  Administered 2017-08-21: 4 mg via INTRAVENOUS

## 2017-08-21 MED ORDER — BUPIVACAINE IN DEXTROSE 0.75-8.25 % IT SOLN
INTRATHECAL | Status: DC | PRN
  Administered 2017-08-21: 1.6 mg via INTRATHECAL

## 2017-08-21 MED ORDER — DOCUSATE SODIUM 100 MG PO CAPS: 100.0000 mg | ORAL_CAPSULE | Freq: Two times a day (BID) | ORAL | Status: DC

## 2017-08-21 MED ORDER — INSULIN ASPART 100 UNIT/ML ~~LOC~~ SOLN: 0.0000 [IU] | Freq: Three times a day (TID) | SUBCUTANEOUS | Status: DC

## 2017-08-21 MED ORDER — BUPIVACAINE-EPINEPHRINE (PF) 0.5% -1:200000 IJ SOLN
INTRAMUSCULAR | Status: AC
  Filled 2017-08-21: qty 60

## 2017-08-21 MED ORDER — SODIUM CHLORIDE 0.9% FLUSH
INTRAVENOUS | Status: DC | PRN
  Administered 2017-08-21: 50 mL

## 2017-08-21 MED ORDER — FENTANYL CITRATE (PF) 100 MCG/2ML IJ SOLN
25.0000 ug | INTRAMUSCULAR | Status: DC | PRN
  Administered 2017-08-21: 50 ug via INTRAVENOUS

## 2017-08-21 MED ORDER — MEPERIDINE HCL 50 MG/ML IJ SOLN: 6.2500 mg | INTRAMUSCULAR | Status: DC | PRN

## 2017-08-21 MED ORDER — ONDANSETRON HCL 4 MG/2ML IJ SOLN: 4.0000 mg | Freq: Four times a day (QID) | INTRAMUSCULAR | Status: DC | PRN

## 2017-08-21 MED ORDER — SODIUM CHLORIDE 0.9 % IV SOLN
INTRAVENOUS | Status: DC
  Administered 2017-08-21 – 2017-08-22 (×2): via INTRAVENOUS

## 2017-08-21 MED ORDER — MIDAZOLAM HCL 2 MG/2ML IJ SOLN
INTRAMUSCULAR | Status: AC
  Filled 2017-08-21: qty 2

## 2017-08-21 MED ORDER — BISACODYL 10 MG RE SUPP: 10.0000 mg | Freq: Every day | RECTAL | Status: DC | PRN

## 2017-08-21 MED ORDER — BUPIVACAINE LIPOSOME 1.3 % IJ SUSP
INTRAMUSCULAR | Status: DC | PRN
  Administered 2017-08-21: 20 mL

## 2017-08-21 MED ORDER — OXYCODONE HCL 5 MG PO TABS: ORAL_TABLET | ORAL | 0 refills | Status: DC

## 2017-08-21 MED ORDER — SPIRONOLACTONE 25 MG PO TABS
25.0000 mg | ORAL_TABLET | Freq: Two times a day (BID) | ORAL | Status: DC
  Administered 2017-08-21 – 2017-08-23 (×4): 25 mg via ORAL
  Filled 2017-08-21 (×4): qty 1

## 2017-08-21 MED ORDER — OXYCODONE HCL 5 MG PO TABS
5.0000 mg | ORAL_TABLET | ORAL | Status: DC | PRN
  Administered 2017-08-21: 10 mg via ORAL

## 2017-08-21 MED ORDER — ONDANSETRON HCL 4 MG/2ML IJ SOLN
INTRAMUSCULAR | Status: AC
  Filled 2017-08-21: qty 2

## 2017-08-21 SURGICAL SUPPLY — 66 items
BANDAGE ACE 4X5 VEL STRL LF (GAUZE/BANDAGES/DRESSINGS) ×3 IMPLANT
BANDAGE ACE 6X5 VEL STRL LF (GAUZE/BANDAGES/DRESSINGS) ×3 IMPLANT
BANDAGE ESMARK 6X9 LF (GAUZE/BANDAGES/DRESSINGS) ×1 IMPLANT
BLADE SAGITTAL 25.0X1.19X90 (BLADE) ×2 IMPLANT
BLADE SAGITTAL 25.0X1.19X90MM (BLADE) ×1
BLADE SAW SAG 90X13X1.27 (BLADE) ×3 IMPLANT
BNDG CMPR 9X6 STRL LF SNTH (GAUZE/BANDAGES/DRESSINGS) ×1
BNDG ESMARK 6X9 LF (GAUZE/BANDAGES/DRESSINGS) ×3
BONE CEMENT GENTAMICIN (Cement) ×6 IMPLANT
BOWL SMART MIX CTS (DISPOSABLE) ×3 IMPLANT
CAP KNEE TOTAL 3 SIGMA ×2 IMPLANT
CEMENT BONE GENTAMICIN 40 (Cement) IMPLANT
COVER SURGICAL LIGHT HANDLE (MISCELLANEOUS) ×3 IMPLANT
CUFF TOURNIQUET SINGLE 34IN LL (TOURNIQUET CUFF) ×3 IMPLANT
CUFF TOURNIQUET SINGLE 44IN (TOURNIQUET CUFF) IMPLANT
DRAPE INCISE IOBAN 66X45 STRL (DRAPES) IMPLANT
DRAPE ORTHO SPLIT 77X108 STRL (DRAPES) ×6
DRAPE SURG ORHT 6 SPLT 77X108 (DRAPES) ×2 IMPLANT
DRAPE U-SHAPE 47X51 STRL (DRAPES) ×3 IMPLANT
DRSG ADAPTIC 3X8 NADH LF (GAUZE/BANDAGES/DRESSINGS) ×3 IMPLANT
DRSG PAD ABDOMINAL 8X10 ST (GAUZE/BANDAGES/DRESSINGS) ×6 IMPLANT
DURAPREP 26ML APPLICATOR (WOUND CARE) ×3 IMPLANT
ELECT REM PT RETURN 9FT ADLT (ELECTROSURGICAL) ×3
ELECTRODE REM PT RTRN 9FT ADLT (ELECTROSURGICAL) ×1 IMPLANT
EVACUATOR 1/8 PVC DRAIN (DRAIN) IMPLANT
FACESHIELD WRAPAROUND (MASK) ×3 IMPLANT
FACESHIELD WRAPAROUND OR TEAM (MASK) ×1 IMPLANT
GAUZE SPONGE 4X4 12PLY STRL (GAUZE/BANDAGES/DRESSINGS) ×3 IMPLANT
GLOVE BIOGEL PI IND STRL 8 (GLOVE) ×4 IMPLANT
GLOVE BIOGEL PI INDICATOR 8 (GLOVE) ×8
GLOVE ORTHO TXT STRL SZ7.5 (GLOVE) ×3 IMPLANT
GLOVE SURG ORTHO 8.0 STRL STRW (GLOVE) ×3 IMPLANT
GOWN STRL REUS W/ TWL LRG LVL3 (GOWN DISPOSABLE) ×1 IMPLANT
GOWN STRL REUS W/ TWL XL LVL3 (GOWN DISPOSABLE) ×1 IMPLANT
GOWN STRL REUS W/TWL 2XL LVL3 (GOWN DISPOSABLE) ×3 IMPLANT
GOWN STRL REUS W/TWL LRG LVL3 (GOWN DISPOSABLE) ×3
GOWN STRL REUS W/TWL XL LVL3 (GOWN DISPOSABLE) ×3
HANDPIECE INTERPULSE COAX TIP (DISPOSABLE) ×3
HOOD PEEL AWAY FACE SHEILD DIS (HOOD) ×3 IMPLANT
IMMOBILIZER KNEE 22 (SOFTGOODS) ×2 IMPLANT
IMMOBILIZER KNEE 22 UNIV (SOFTGOODS) IMPLANT
KIT BASIN OR (CUSTOM PROCEDURE TRAY) ×3 IMPLANT
KIT TURNOVER KIT B (KITS) ×3 IMPLANT
MANIFOLD NEPTUNE II (INSTRUMENTS) ×3 IMPLANT
NEEDLE 22X1 1/2 (OR ONLY) (NEEDLE) ×6 IMPLANT
NS IRRIG 1000ML POUR BTL (IV SOLUTION) ×3 IMPLANT
PACK TOTAL JOINT (CUSTOM PROCEDURE TRAY) ×3 IMPLANT
PAD ABD 8X10 STRL (GAUZE/BANDAGES/DRESSINGS) ×2 IMPLANT
PAD ARMBOARD 7.5X6 YLW CONV (MISCELLANEOUS) ×6 IMPLANT
PAD CAST 4YDX4 CTTN HI CHSV (CAST SUPPLIES) ×1 IMPLANT
PADDING CAST COTTON 4X4 STRL (CAST SUPPLIES) ×3
PADDING CAST COTTON 6X4 STRL (CAST SUPPLIES) ×3 IMPLANT
SET HNDPC FAN SPRY TIP SCT (DISPOSABLE) ×1 IMPLANT
STAPLER VISISTAT 35W (STAPLE) ×3 IMPLANT
SUCTION FRAZIER HANDLE 10FR (MISCELLANEOUS) ×2
SUCTION TUBE FRAZIER 10FR DISP (MISCELLANEOUS) ×1 IMPLANT
SUT ETHIBOND NAB CT1 #1 30IN (SUTURE) ×9 IMPLANT
SUT VIC AB 0 CT1 27 (SUTURE) ×3
SUT VIC AB 0 CT1 27XBRD ANBCTR (SUTURE) ×1 IMPLANT
SUT VIC AB 2-0 CT1 27 (SUTURE) ×6
SUT VIC AB 2-0 CT1 TAPERPNT 27 (SUTURE) ×2 IMPLANT
SYR CONTROL 10ML LL (SYRINGE) ×6 IMPLANT
TOWEL GREEN STERILE (TOWEL DISPOSABLE) ×3 IMPLANT
TRAY CATH 16FR W/PLASTIC CATH (SET/KITS/TRAYS/PACK) IMPLANT
TRAY FOLEY MTR SLVR 16FR STAT (SET/KITS/TRAYS/PACK) IMPLANT
WATER STERILE IRR 1000ML POUR (IV SOLUTION) ×3 IMPLANT

## 2017-08-21 NOTE — Brief Op Note (Signed)
08/21/2017  12:28 PM  PATIENT:  Kelly Golden  77 y.o. female  PRE-OPERATIVE DIAGNOSIS:  OA RIGHT KNEE  POST-OPERATIVE DIAGNOSIS:  OA RIGHT KNEE  PROCEDURE:  Procedure(s): TOTAL KNEE ARTHROPLASTY (Right)  SURGEON:  Surgeon(s) and Role:    Frederico Hamman* Caffrey, Daniel, MD - Primary  PHYSICIAN ASSISTANT: Margart SicklesJoshua Daaiel Starlin, PA-C  ASSISTANTS: OR staff x1    ANESTHESIA:   local and spinal  EBL:  minimal  BLOOD ADMINISTERED:none  DRAINS: none   LOCAL MEDICATIONS USED:  MARCAINE     SPECIMEN:  No Specimen  DISPOSITION OF SPECIMEN:  N/A  COUNTS:  YES  TOURNIQUET:   Total Tourniquet Time Documented: Thigh (Right) - 58 minutes Total: Thigh (Right) - 58 minutes   DICTATION: .Other Dictation: Dictation Number unknown  PLAN OF CARE: Admit for overnight observation  PATIENT DISPOSITION:  PACU - hemodynamically stable.   Delay start of Pharmacological VTE agent (>24hrs) due to surgical blood loss or risk of bleeding: yes

## 2017-08-21 NOTE — Anesthesia Procedure Notes (Signed)
Spinal  Patient location during procedure: OR Start time: 08/21/2017 7:40 AM End time: 08/21/2017 7:45 AM Staffing Anesthesiologist: Bethena Midgetddono, Bilan Tedesco, MD Preanesthetic Checklist Completed: patient identified, site marked, surgical consent, pre-op evaluation, timeout performed, IV checked, risks and benefits discussed and monitors and equipment checked Spinal Block Patient position: sitting Prep: DuraPrep Patient monitoring: heart rate, cardiac monitor, continuous pulse ox and blood pressure Approach: midline Location: L4-5 Injection technique: single-shot Needle Needle type: Sprotte  Needle gauge: 24 G Needle length: 9 cm Assessment Sensory level: T4

## 2017-08-21 NOTE — Anesthesia Postprocedure Evaluation (Signed)
Anesthesia Post Note  Patient: Orlene OchBarbara R Golden  Procedure(s) Performed: TOTAL KNEE ARTHROPLASTY (Right Knee)     Patient location during evaluation: PACU Anesthesia Type: Regional Level of consciousness: oriented and awake and alert Pain management: pain level controlled Vital Signs Assessment: post-procedure vital signs reviewed and stable Respiratory status: spontaneous breathing, respiratory function stable and patient connected to nasal cannula oxygen Cardiovascular status: blood pressure returned to baseline and stable Postop Assessment: no headache, no backache and no apparent nausea or vomiting Anesthetic complications: no    Last Vitals:  Vitals:   08/21/17 1230 08/21/17 1245  BP: (!) 159/77 (!) 142/85  Pulse: 63 72  Resp: 16 17  Temp:    SpO2: 99% 99%    Last Pain:  Vitals:   08/21/17 1300  TempSrc:   PainSc: 4                  Kimorah Ridolfi

## 2017-08-21 NOTE — Anesthesia Procedure Notes (Signed)
Anesthesia Regional Block: Adductor canal block   Pre-Anesthetic Checklist: ,, timeout performed, Correct Patient, Correct Site, Correct Laterality, Correct Procedure, Correct Position, site marked, Risks and benefits discussed,  Surgical consent,  Pre-op evaluation,  At surgeon's request and post-op pain management  Laterality: Right  Prep: chloraprep       Needles:  Injection technique: Single-shot  Needle Type: Echogenic Stimulator Needle     Needle Length: 5cm  Needle Gauge: 22     Additional Needles:   Procedures:, nerve stimulator,,, ultrasound used (permanent image in chart),,,,  Narrative:  Start time: 08/21/2017 7:00 AM End time: 08/21/2017 7:05 AM Injection made incrementally with aspirations every 5 mL.  Performed by: Personally  Anesthesiologist: Bethena Midgetddono, Jaris Kohles, MD  Additional Notes: Functioning IV was confirmed and monitors were applied.  A 50mm 22ga Arrow echogenic stimulator needle was used. Sterile prep and drape,hand hygiene and sterile gloves were used. Ultrasound guidance: relevant anatomy identified, needle position confirmed, local anesthetic spread visualized around nerve(s)., vascular puncture avoided.  Image printed for medical record. Negative aspiration and negative test dose prior to incremental administration of local anesthetic. The patient tolerated the procedure well.

## 2017-08-21 NOTE — Transfer of Care (Signed)
Immediate Anesthesia Transfer of Care Note  Patient: Kelly Golden  Procedure(s) Performed: TOTAL KNEE ARTHROPLASTY (Right Knee)  Patient Location: PACU  Anesthesia Type:Regional and Spinal  Level of Consciousness: awake, alert  and patient cooperative  Airway & Oxygen Therapy: Patient Spontanous Breathing and Patient connected to face mask oxygen  Post-op Assessment: Report given to RN and Post -op Vital signs reviewed and stable  Post vital signs: Reviewed and stable  Last Vitals:  Vitals Value Taken Time  BP    Temp    Pulse 64 08/21/2017  9:44 AM  Resp 19 08/21/2017  9:44 AM  SpO2 100 % 08/21/2017  9:44 AM  Vitals shown include unvalidated device data.  Last Pain:  Vitals:   08/21/17 0616  TempSrc:   PainSc: 1       Patients Stated Pain Goal: 7 (08/21/17 [id number])  Complications: No apparent anesthesia complications

## 2017-08-21 NOTE — Evaluation (Signed)
Physical Therapy Evaluation Patient Details Name: Kelly Golden MRN: [id number] DOB: 07/10/1940 Today's Date: 08/21/2017   History of Present Illness  Pt is a 77 y/o female s/p elective R TKA. PMH includes DM and HTN.   Clinical Impression  Pt is s/p surgery above with deficits below. Pt with notable R knee instability following surgery with use of R KI, therefore mobility limited to chair. Educated about knee precautions and supine HEP. Will continue to follow acutely to maximize functional mobility independence and safety.     Follow Up Recommendations Follow surgeon's recommendation for DC plan and follow-up therapies;Supervision for mobility/OOB    Equipment Recommendations  None recommended by PT    Recommendations for Other Services       Precautions / Restrictions Precautions Precautions: Knee Precaution Booklet Issued: Yes (comment) Precaution Comments: Reviewed knee precautions and supine HEP.  Required Braces or Orthoses: Knee Immobilizer - Right Knee Immobilizer - Right: Other (comment)(discontinue POD 1) Restrictions Weight Bearing Restrictions: Yes RLE Weight Bearing: Weight bearing as tolerated      Mobility  Bed Mobility Overal bed mobility: Needs Assistance Bed Mobility: Supine to Sit     Supine to sit: Supervision     General bed mobility comments: Supervision for safety. Increased time required.   Transfers Overall transfer level: Needs assistance Equipment used: Rolling walker (2 wheeled) Transfers: Sit to/from UGI CorporationStand;Stand Pivot Transfers Sit to Stand: Min assist Stand pivot transfers: Min assist       General transfer comment: Min A for lift assist and steadying to stand. Verbal cues for hand placement. Min A for steadying throughout transfer secondary to R knee instability.   Ambulation/Gait Ambulation/Gait assistance: Min assist Ambulation Distance (Feet): 2 Feet Assistive device: Rolling walker (2 wheeled) Gait Pattern/deviations:  Step-through pattern;Decreased step length - left;Decreased step length - right;Decreased weight shift to right;Antalgic Gait velocity: Decreased    General Gait Details: Slow, antalgic gait. Instability noted in R knee. Min A for steadying during gait to chair. Verbal cues for sequencing using RW.   Stairs            Wheelchair Mobility    Modified Rankin (Stroke Patients Only)       Balance Overall balance assessment: Needs assistance Sitting-balance support: No upper extremity supported;Feet supported Sitting balance-Leahy Scale: Good     Standing balance support: Bilateral upper extremity supported;During functional activity Standing balance-Leahy Scale: Poor Standing balance comment: Reliant on BUE support.                              Pertinent Vitals/Pain Pain Assessment: 0-10 Pain Score: 5  Pain Location: R knee  Pain Descriptors / Indicators: Aching;Operative site guarding Pain Intervention(s): Limited activity within patient's tolerance;Monitored during session;Repositioned    Home Living Family/patient expects to be discharged to:: Private residence Living Arrangements: Spouse/significant other Available Help at Discharge: Family;Available 24 hours/day Type of Home: House Home Access: Stairs to enter Entrance Stairs-Rails: Left Entrance Stairs-Number of Steps: 3 Home Layout: One level Home Equipment: Walker - 2 wheels;Bedside commode;Cane - single point      Prior Function Level of Independence: Independent with assistive device(s)         Comments: Used cane vs RW for ambulation depending on the day.      Hand Dominance   Dominant Hand: Left    Extremity/Trunk Assessment   Upper Extremity Assessment Upper Extremity Assessment: Defer to OT evaluation    Lower Extremity  Assessment Lower Extremity Assessment: RLE deficits/detail RLE Deficits / Details: Sensory in tact. Deficits consistent with post op pain and weakness. Able  to perform ther ex below.      Cervical / Trunk Assessment Cervical / Trunk Assessment: Normal  Communication   Communication: No difficulties  Cognition Arousal/Alertness: Awake/alert Behavior During Therapy: WFL for tasks assessed/performed Overall Cognitive Status: Within Functional Limits for tasks assessed                                        General Comments General comments (skin integrity, edema, etc.): Pt's husband present during session.     Exercises Total Joint Exercises Ankle Circles/Pumps: AROM;Both;20 reps Quad Sets: AROM;Right;10 reps Heel Slides: AROM;Right;10 reps   Assessment/Plan    PT Assessment Patient needs continued PT services  PT Problem List Decreased strength;Decreased range of motion;Decreased balance;Decreased mobility;Decreased knowledge of use of DME;Decreased knowledge of precautions;Pain       PT Treatment Interventions DME instruction;Gait training;Stair training;Functional mobility training;Therapeutic activities;Therapeutic exercise;Balance training;Patient/family education    PT Goals (Current goals can be found in the Care Plan section)  Acute Rehab PT Goals Patient Stated Goal: to go home  PT Goal Formulation: With patient Time For Goal Achievement: 09/04/17 Potential to Achieve Goals: Good    Frequency 7X/week   Barriers to discharge        Co-evaluation               AM-PAC PT "6 Clicks" Daily Activity  Outcome Measure Difficulty turning over in bed (including adjusting bedclothes, sheets and blankets)?: A Little Difficulty moving from lying on back to sitting on the side of the bed? : A Little Difficulty sitting down on and standing up from a chair with arms (e.g., wheelchair, bedside commode, etc,.)?: Unable Help needed moving to and from a bed to chair (including a wheelchair)?: A Little Help needed walking in hospital room?: A Little Help needed climbing 3-5 steps with a railing? : A Lot 6  Click Score: 15    End of Session Equipment Utilized During Treatment: Gait belt;Right knee immobilizer Activity Tolerance: Patient tolerated treatment well Patient left: in chair;with call bell/phone within reach;with family/visitor present Nurse Communication: Mobility status PT Visit Diagnosis: Unsteadiness on feet (R26.81);Other abnormalities of gait and mobility (R26.89);Pain Pain - Right/Left: Right Pain - part of body: Knee    Time: 1610-9604 PT Time Calculation (min) (ACUTE ONLY): 32 min   Charges:   PT Evaluation $PT Eval Low Complexity: 1 Low PT Treatments $Therapeutic Activity: 8-22 mins   PT G Codes:        Gladys Damme, PT, DPT  Acute Rehabilitation Services  Pager: [phone]   Lehman Prom 08/21/2017, 4:30 PM

## 2017-08-21 NOTE — Anesthesia Procedure Notes (Signed)
Procedure Name: MAC Date/Time: 08/21/2017 7:47 AM Performed by: Orlie Dakin, CRNA Pre-anesthesia Checklist: Patient identified, Emergency Drugs available, Suction available, Patient being monitored and Timeout performed Patient Re-evaluated:Patient Re-evaluated prior to induction Oxygen Delivery Method: Simple face mask Preoxygenation: Pre-oxygenation with 100% oxygen Induction Type: IV induction

## 2017-08-21 NOTE — Interval H&P Note (Signed)
History and Physical Interval Note:  08/21/2017 7:30 AM  Orlene OchBarbara R Spradlin  has presented today for surgery, with the diagnosis of OA RIGHT KNEE  The various methods of treatment have been discussed with the patient and family. After consideration of risks, benefits and other options for treatment, the patient has consented to  Procedure(s): TOTAL KNEE ARTHROPLASTY (Right) as a surgical intervention .  The patient's history has been reviewed, patient examined, no change in status, stable for surgery.  I have reviewed the patient's chart and labs.  Questions were answered to the patient's satisfaction.     Thera FlakeW D Averiana Clouatre Jr

## 2017-08-21 NOTE — Discharge Instructions (Signed)

## 2017-08-21 NOTE — Progress Notes (Signed)
Orthopedic Tech Progress Note Patient Details:  Kelly Golden 05/09/1940 [id number]  CPM Right Knee CPM Right Knee: On Right Knee Flexion (Degrees): 90 Right Knee Extension (Degrees): 0 Additional Comments: Trapeze bar and foot roll  Post Interventions Patient Tolerated: Well Instructions Provided: Care of device, Adjustment of device  Saul FordyceJennifer C Parissa Chiao 08/21/2017, 10:49 AM

## 2017-08-21 NOTE — Plan of Care (Signed)
  Problem: Education: Goal: Knowledge of General Education information will improve Outcome: Progressing   

## 2017-08-21 NOTE — Care Management Note (Signed)
Case Management Note  Patient Details  Name: Kelly Golden MRN: [id number] Date of Birth: 03/10/1941  Subjective/Objective:   77 yr old [place] admitted for right total knee arthroplasty.                 Action/Plan:  Patient was preoperatively setup with [facility].   Expected Discharge Date:                  Expected Discharge Plan:  Home w Home Health Services  In-House Referral:     Discharge planning Services  CM Consult  Post Acute Care Choice:  Durable Medical Equipment, Home Health Choice offered to:     DME Arranged:  3-N-1, Walker rolling, CPM DME Agency:  [employer]  HH Arranged:  PT HH Agency:  [facility]  Status of Service:  In process, will continue to follow  If discussed at Long Length of Stay Meetings, dates discussed:    Additional Comments:  Durenda GuthrieBrady, Neamiah Sciarra [name], RN 08/21/2017, 10:11 AM

## 2017-08-22 ENCOUNTER — Encounter (HOSPITAL_COMMUNITY): Payer: Self-pay | Admitting: Orthopedic Surgery

## 2017-08-22 DIAGNOSIS — Z79899 Other long term (current) drug therapy: Secondary | ICD-10-CM | POA: Diagnosis not present

## 2017-08-22 DIAGNOSIS — Z87891 Personal history of nicotine dependence: Secondary | ICD-10-CM | POA: Diagnosis not present

## 2017-08-22 DIAGNOSIS — I10 Essential (primary) hypertension: Secondary | ICD-10-CM | POA: Diagnosis present

## 2017-08-22 DIAGNOSIS — M62461 Contracture of muscle, right lower leg: Secondary | ICD-10-CM | POA: Diagnosis not present

## 2017-08-22 DIAGNOSIS — E119 Type 2 diabetes mellitus without complications: Secondary | ICD-10-CM

## 2017-08-22 DIAGNOSIS — Z7982 Long term (current) use of aspirin: Secondary | ICD-10-CM | POA: Diagnosis not present

## 2017-08-22 DIAGNOSIS — M21161 Varus deformity, not elsewhere classified, right knee: Secondary | ICD-10-CM | POA: Diagnosis not present

## 2017-08-22 DIAGNOSIS — Z9841 Cataract extraction status, right eye: Secondary | ICD-10-CM | POA: Diagnosis not present

## 2017-08-22 DIAGNOSIS — M1711 Unilateral primary osteoarthritis, right knee: Secondary | ICD-10-CM | POA: Diagnosis not present

## 2017-08-22 LAB — CBC
HEMATOCRIT: 39.9 % (ref 36.0–46.0)
Hemoglobin: 12.2 g/dL (ref 12.0–15.0)
MCH: 24.5 pg — ABNORMAL LOW (ref 26.0–34.0)
MCHC: 30.6 g/dL (ref 30.0–36.0)
MCV: 80.3 fL (ref 78.0–100.0)
Platelets: 281 10*3/uL (ref 150–400)
RBC: 4.97 MIL/uL (ref 3.87–5.11)
RDW: 14.8 % (ref 11.5–15.5)
WBC: 13.6 10*3/uL — AB (ref 4.0–10.5)

## 2017-08-22 LAB — BASIC METABOLIC PANEL
ANION GAP: 6 (ref 5–15)
BUN: 10 mg/dL (ref 6–20)
CALCIUM: 9.6 mg/dL (ref 8.9–10.3)
CO2: 26 mmol/L (ref 22–32)
Chloride: 108 mmol/L (ref 101–111)
Creatinine, Ser: 0.87 mg/dL (ref 0.44–1.00)
GFR calc Af Amer: >60 mL/min (ref >60)
Glucose, Bld: 144 mg/dL — ABNORMAL HIGH (ref 65–99)
Potassium: 4.9 mmol/L (ref 3.5–5.1)
Sodium: 140 mmol/L (ref 135–145)

## 2017-08-22 LAB — GLUCOSE, CAPILLARY
GLUCOSE-CAPILLARY: 208 mg/dL — AB (ref 65–99)
GLUCOSE-CAPILLARY: 161 mg/dL — AB (ref 65–99)
Glucose-Capillary: 128 mg/dL — ABNORMAL HIGH (ref 65–99)
Glucose-Capillary: 222 mg/dL — ABNORMAL HIGH (ref 65–99)
Glucose-Capillary: 171 mg/dL — ABNORMAL HIGH (ref 65–99)

## 2017-08-22 MED ORDER — OXYCODONE HCL 5 MG PO TABS
5.0000 mg | ORAL_TABLET | ORAL | Status: DC | PRN
  Administered 2017-08-22 – 2017-08-23 (×3): 5 mg via ORAL
  Filled 2017-08-22 (×3): qty 1

## 2017-08-22 NOTE — Progress Notes (Signed)
Patient refused noon dose of insulin.  Blood sugar 161.  Patient educated on importance of controlling blood sugar level.  She again stated she does not use insulin at home and does not want it while in the hospital

## 2017-08-22 NOTE — Progress Notes (Signed)
Patient refused morning dose of insulin stating she does not use it at home and does not want to take it while in the hospital.

## 2017-08-22 NOTE — Evaluation (Signed)
Occupational Therapy Evaluation Patient Details Name: Kelly Golden MRN: [id number] DOB: Sep 02, 1940 Today's Date: 08/22/2017    History of Present Illness Pt is a 77 y/o female s/p elective R TKA. PMH includes DM and HTN.    Clinical Impression   PTA, pt was living with her husband and children and was independent. Currently, pt requires Min Guard A for majority of LB ADLs with exception of sock management where pt required Max A. Pt requiring Min Guard A for functional mobility using RW. Provided education on LB ADLs and toilet transfer; pt demonstrated understanding. Pt will require further acute OT to address safe tub transfers. Recommend dc home once medically stable per physician.     Follow Up Recommendations  Follow surgeon's recommendation for DC plan and follow-up therapies;Supervision/Assistance - 24 hour    Equipment Recommendations  None recommended by OT    Recommendations for Other Services PT consult     Precautions / Restrictions Precautions Precautions: Knee Precaution Booklet Issued: Yes (comment) Precaution Comments: Reviewed knee precautions and resting in extension Required Braces or Orthoses: (KI d/c'd per order) Knee Immobilizer - Right: Other (comment)(discontinue POD 1) Restrictions Weight Bearing Restrictions: Yes RLE Weight Bearing: Weight bearing as tolerated      Mobility Bed Mobility               General bed mobility comments: In recliner upon arrival  Transfers Overall transfer level: Needs assistance Equipment used: Rolling walker (2 wheeled) Transfers: Sit to/from Stand Sit to Stand: Min guard         General transfer comment: Min Guard A for safety. VCs for hand placement and slowing down    Balance Overall balance assessment: Needs assistance Sitting-balance support: No upper extremity supported;Feet supported Sitting balance-Leahy Scale: Good     Standing balance support: Bilateral upper extremity supported;During  functional activity Standing balance-Leahy Scale: Poor Standing balance comment: Reliant on  single UE support.                            ADL either performed or assessed with clinical judgement   ADL Overall ADL's : Needs assistance/impaired Eating/Feeding: Set up;Sitting   Grooming: Set up;Sitting   Upper Body Bathing: Set up;Sitting   Lower Body Bathing: Min guard;Sit to/from stand Lower Body Bathing Details (indicate cue type and reason): Min Guard A for safety in standing Upper Body Dressing : Set up;Sitting;With caregiver independent assisting Upper Body Dressing Details (indicate cue type and reason): donned gown with daughter's assistance Lower Body Dressing: Maximal assistance;Sit to/from stand;Min guard Lower Body Dressing Details (indicate cue type and reason): Pt donning depends with Min Guard A for safety in standing. Providing education on donning RLE first to increase safety and independence. Pt unable to don/doff socks and would benefit from education on AE for sock management.  Toilet Transfer: Min guard;Ambulation;RW(Simulated to recliner)         Tub/Shower Transfer Details (indicate cue type and reason): Discussed use of 3N1 as shower seat. Will need further education on safe tub transfers. Functional mobility during ADLs: Min guard;Rolling walker General ADL Comments: Pt demonstrating high motivation to increase independence and participate in therapy     Vision         Perception     Praxis      Pertinent Vitals/Pain Pain Assessment: Faces Pain Score: (pt stating an "8" but states "not much") Faces Pain Scale: Hurts a little bit Pain Location: R knee  Pain Descriptors / Indicators: Aching;Operative site guarding Pain Intervention(s): Monitored during session;Repositioned     Hand Dominance Left   Extremity/Trunk Assessment Upper Extremity Assessment Upper Extremity Assessment: Overall WFL for tasks assessed   Lower Extremity  Assessment Lower Extremity Assessment: Defer to PT evaluation RLE Deficits / Details: Sensory in tact. Deficits consistent with post op pain and weakness. Able to perform ther ex below.     Cervical / Trunk Assessment Cervical / Trunk Assessment: Normal   Communication Communication Communication: No difficulties   Cognition Arousal/Alertness: Awake/alert Behavior During Therapy: WFL for tasks assessed/performed Overall Cognitive Status: Within Functional Limits for tasks assessed                                     General Comments  Pt's daughter present throughout session    Exercises    Shoulder Instructions      Home Living Family/patient expects to be discharged to:: Private residence Living Arrangements: Spouse/significant other Available Help at Discharge: Family;Available 24 hours/day Type of Home: House Home Access: Stairs to enter Enter[employer] CorporationEntrance Stairs-Number of Steps: 3 Entrance Stairs-Rails: Left Home Layout: One level     Bathroom Shower/Tub: Chief Strategy OfficerTub/shower unit   Bathroom Toilet: Standard     Home Equipment: Environmental consultantWalker - 2 wheels;Bedside commode;Cane - single point          Prior Functioning/Environment Level of Independence: Independent with assistive device(s)        Comments: Used cane vs RW for ambulation depending on the day.         OT Problem List: Decreased strength;Decreased range of motion;Decreased activity tolerance;Impaired balance (sitting and/or standing);Decreased knowledge of use of DME or AE;Decreased knowledge of precautions;Pain      OT Treatment/Interventions: Self-care/ADL training;Therapeutic exercise;Energy conservation;DME and/or AE instruction;Therapeutic activities;Patient/family education    OT Goals(Current goals can be found in the care plan section) Acute Rehab OT Goals Patient Stated Goal: to go home  OT Goal Formulation: With patient Time For Goal Achievement: 09/05/17 Potential to Achieve Goals: Good ADL  Goals Pt Will Perform Grooming: with set-up;with supervision;standing Pt Will Perform Lower Body Dressing: with supervision;with set-up;sit to/from stand;with adaptive equipment Pt Will Transfer to Toilet: with set-up;with supervision;ambulating;bedside commode Pt Will Perform Tub/Shower Transfer: Tub transfer;with min guard assist;ambulating;3 in 1;rolling walker  OT Frequency: Min 2X/week   Barriers to D/C:            Co-evaluation              AM-PAC PT "6 Clicks" Daily Activity     Outcome Measure Help from another person eating meals?: None Help from another person taking care of personal grooming?: A Little Help from another person toileting, which includes using toliet, bedpan, or urinal?: A Little Help from another person bathing (including washing, rinsing, drying)?: A Little Help from another person to put on and taking off regular upper body clothing?: A Little Help from another person to put on and taking off regular lower body clothing?: A Lot 6 Click Score: 18   End of Session Equipment Utilized During Treatment: Rolling walker Nurse Communication: Mobility status;Precautions  Activity Tolerance: Patient tolerated treatment well Patient left: in chair;with call bell/phone within reach;with family/visitor present(with PT)  OT Visit Diagnosis: Unsteadiness on feet (R26.81);Other abnormalities of gait and mobility (R26.89);Muscle weakness (generalized) (M62.81);Pain Pain - Right/Left: Right Pain - part of body: Knee  Time: 1610-9604 OT Time Calculation (min): 13 min Charges:  OT General Charges $OT Visit: 1 Visit OT Evaluation $OT Eval Low Complexity: 1 Low G-Codes:     Kentrell Hallahan MSOT, OTR/L Acute Rehab Pager: [phone] Office: [phone]  Kelly Golden 08/22/2017, 11:43 AM

## 2017-08-22 NOTE — Progress Notes (Signed)
Physical Therapy Treatment Patient Details Name: Kelly Golden MRN: [id number] DOB: 11/08/1940 Today's Date: 08/22/2017    History of Present Illness Pt is a 77 y/o female s/p elective R TKA. PMH includes DM and HTN.     PT Comments    Patient in CPM on arrival and did not want to come out of machine (and then dinner arrived). She requested I educate her and daughter on exercises she should do later this evening. Utilized the handout with pictures of exercises and completed exercise with LLE (due to RLE in CPM) to assure understanding.    Follow Up Recommendations  Follow surgeon's recommendation for DC plan and follow-up therapies;Supervision for mobility/OOB     Equipment Recommendations  None recommended by PT    Recommendations for Other Services       Precautions / Restrictions Precautions Precautions: Knee Precaution Booklet Issued: Yes (comment) Precaution Comments: Reviewed knee precautions and supine HEP.  Required Braces or Orthoses: (KI d/c'd per order) Knee Immobilizer - Right: Other (comment)(discontinue POD 1) Restrictions Weight Bearing Restrictions: Yes RLE Weight Bearing: Weight bearing as tolerated    Mobility  Bed Mobility                  Transfers                    Ambulation/Gait                 Stairs             Wheelchair Mobility    Modified Rankin (Stroke Patients Only)       Balance                                            Cognition Arousal/Alertness: Awake/alert Behavior During Therapy: WFL for tasks assessed/performed Overall Cognitive Status: Within Functional Limits for tasks assessed                                        Exercises Total Joint Exercises Ankle Circles/Pumps: (pt educated to do these whenever she can; done this a.m.) Quad Sets: AROM;Left;Supine(pt educated on LLE as RLE in CPM) Heel Slides: AROM;Supine;Left(pt educated on LLE as RLE  in CPM) Hip ABduction/ADduction: Strengthening;Left;Supine(pt educated on LLE as RLE in CPM)    General Comments General comments (skin integrity, edema, etc.): Patient in CPM on arrival and did not want to come out of machine (and then dinner arrived). She requested I educate her and daughter on exercises she should do later this evening.       Pertinent Vitals/Pain Pain Assessment: Faces Pain Score: 2  Faces Pain Scale: Hurts a little bit Pain Location: R knee  Pain Descriptors / Indicators: Aching;Operative site guarding Pain Intervention(s): Monitored during session    Home Living                      Prior Function            PT Goals (current goals can now be found in the care plan section) Progress towards PT goals: Progressing toward goals    Frequency    7X/week      PT Plan Current plan remains appropriate    Co-evaluation  AM-PAC PT "6 Clicks" Daily Activity  Outcome Measure  Difficulty turning over in bed (including adjusting bedclothes, sheets and blankets)?: A Little Difficulty moving from lying on back to sitting on the side of the bed? : A Little Difficulty sitting down on and standing up from a chair with arms (e.g., wheelchair, bedside commode, etc,.)?: A Little Help needed moving to and from a bed to chair (including a wheelchair)?: A Little Help needed walking in hospital room?: A Little Help needed climbing 3-5 steps with a railing? : A Little 6 Click Score: 18    End of Session   Activity Tolerance: Patient tolerated treatment well Patient left: in bed;with call bell/phone within reach;with family/visitor present;in CPM   PT Visit Diagnosis: Unsteadiness on feet (R26.81);Other abnormalities of gait and mobility (R26.89);Pain Pain - Right/Left: Right Pain - part of body: Knee     Time: 4098-1191 PT Time Calculation (min) (ACUTE ONLY): 10 min  Charges:  $Therapeutic Exercise: 8-22 mins                    G  Codes:          Sherian Rein 08/22/2017, 4:31 PM

## 2017-08-22 NOTE — Plan of Care (Signed)
  Problem: Education: Goal: Knowledge of General Education information will improve Outcome: Progressing   

## 2017-08-22 NOTE — Progress Notes (Signed)
Orthopedic Trauma Service Progress Note Weekend Coverage  Patient ID: Kelly Golden MRN: [id number] DOB/AGE: 77/05/1940 77 y.o.  Subjective:  Doing ok  Pain controlled Did well with PT No specific complaints    Review of Systems  Constitutional: Negative for chills and fever.  Respiratory: Negative for shortness of breath and wheezing.   Cardiovascular: Negative for chest pain and palpitations.  Gastrointestinal: Negative for nausea and vomiting.  Neurological: Negative for tingling and sensory change.   As above  Objective:   VITALS:   Vitals:   08/21/17 1600 08/21/17 2110 08/21/17 2334 08/22/17 0627  BP: 126/83 (!) 148/74 140/83 (!) 154/86  Pulse: 77 70 65 (!) 53  Resp: 18 15    Temp: 97.9 F (36.6 C) (!) 97.5 F (36.4 C) 97.6 F (36.4 C) (!) 97.4 F (36.3 C)  TempSrc: Oral Oral Oral Oral  SpO2: 97% 100% 100% 100%  Weight:        Estimated body mass index is 34.96 kg/m as calculated from the following:   Height as of 08/11/17: 5\' 1"  (1.549 m).   Weight as of this encounter: 83.9 kg (185 lb).   Intake/Output      06/07 0701 - 06/08 0700 06/08 0701 - 06/09 0700   P.O. 240    I.V. (mL/kg) 2441.3 (29.1)    Total Intake(mL/kg) 2681.3 (32)    Urine (mL/kg/hr) 1200 (0.6)    Total Output 1200    Net +1481.3         Urine Occurrence 3 x      LABS  Results for orders placed or performed during the hospital encounter of 08/21/17 (from the past 24 hour(s))  Glucose, capillary     Status: Abnormal   Collection Time: 08/21/17  5:36 PM  Result Value Ref Range   Glucose-Capillary 185 (H) 65 - 99 mg/dL  Glucose, capillary     Status: Abnormal   Collection Time: 08/22/17  1:03 AM  Result Value Ref Range   Glucose-Capillary 208 (H) 65 - 99 mg/dL  CBC     Status: Abnormal   Collection Time: 08/22/17  3:54 AM  Result Value Ref Range   WBC 13.6 (H) 4.0 - 10.5 K/uL   RBC 4.97 3.87 - 5.11 MIL/uL   Hemoglobin 12.2 12.0 - 15.0 g/dL    HCT 78.239.9 95.636.0 - 21.346.0 %   MCV 80.3 78.0 - 100.0 fL   MCH 24.5 (L) 26.0 - 34.0 pg   MCHC 30.6 30.0 - 36.0 g/dL   RDW 08.614.8 57.811.5 - 46.915.5 %   Platelets 281 150 - 400 K/uL  Basic metabolic panel     Status: Abnormal   Collection Time: 08/22/17  3:54 AM  Result Value Ref Range   Sodium 140 135 - 145 mmol/L   Potassium 4.9 3.5 - 5.1 mmol/L   Chloride 108 101 - 111 mmol/L   CO2 26 22 - 32 mmol/L   Glucose, Bld 144 (H) 65 - 99 mg/dL   BUN 10 6 - 20 mg/dL   Creatinine, Ser 6.290.87 0.44 - 1.00 mg/dL   Calcium 9.6 8.9 - 52.810.3 mg/dL   GFR calc non Af Amer >60 >60 mL/min   GFR calc Af Amer >60 >60 mL/min   Anion gap 6 5 - 15  Glucose, capillary     Status: Abnormal   Collection Time: 08/22/17  6:29 AM  Result Value Ref Range   Glucose-Capillary 128 (H) 65 - 99 mg/dL   CBG (last 3)  Recent  Labs    08/21/17 1736 08/22/17 0103 08/22/17 0629  GLUCAP 185* 208* 128*     PHYSICAL EXAM:   Gen: pt seen and evaluated in PT gym. In chair, looks good, good spirits, pleasant Lungs: breathing unlabored Cardiac: RRR Abd: + BS, NTD Ext:       Right Lower Extremity   Dressing c/d/i  Ext warm   + DP pulse  EHL, FHL, lesser toe motor intact  AT, PT, peroneals, gastroc motor intact, + quad set  Compartments are soft, no pain with passive stretch   Swelling controlled   Assessment/Plan: 1 Day Post-Op   Active Problems:   Primary localized osteoarthritis of right knee   Hypertension   Diabetes mellitus without complication (HCC)   Anti-infectives (From admission, onward)   Start     Dose/Rate Route Frequency Ordered Stop   08/21/17 2000  vancomycin (VANCOCIN) IVPB 1000 mg/200 mL premix     1,000 mg 200 mL/hr over 60 Minutes Intravenous Every 12 hours 08/21/17 1414 08/21/17 2123   08/21/17 0800  vancomycin (VANCOCIN) IVPB 1000 mg/200 mL premix     1,000 mg 200 mL/hr over 60 Minutes Intravenous  Once 08/21/17 0753 08/21/17 0900   08/21/17 0600  ceFAZolin (ANCEF) IVPB 2g/100 mL premix     2  g 200 mL/hr over 30 Minutes Intravenous To ShortStay Surgical 08/20/17 1314 08/21/17 0753    .  POD/HD#: 1  77 y/o female s/p R TKA  -Endstage DJD R knee s/p R TKA  WBAT   PT/OT  CPM, ROM as tolerated  Ice PRN   Dc home tomorrow   - Pain management:  Tylenol   Oxycodone   - ABL anemia/Hemodynamics  Stable  - Medical issues   Home meds  - DVT/PE prophylaxis:  ASA 325 mg po daily   - Activity:  WBAT   Up with assistance   - Dispo:  PT/OT evals  Possible dc home later today   Anticipated LOS equal to or greater than 2 midnights due to - Age 78 and older with one or more of the following:  - Obesity  - Expected need for hospital services (PT, OT, Nursing) required for safe  discharge  - Anticipated need for postoperative skilled nursing care or inpatient rehab  - Active co-morbidities: Diabetes OR   - Unanticipated findings during/Post Surgery: Slow post-op progression: GI, pain control, mobility  - Patient is a high risk of re-admission due to: None     Kelly Latin, PA-C [facility] [phone] Traci Sermon (C) 08/22/2017, 10:19 AM

## 2017-08-22 NOTE — Progress Notes (Signed)
Orthopedic Tech Progress Note Patient Details:  Kelly Golden 02/13/1941 [id number]  CPM Right Knee CPM Right Knee: On Right Knee Flexion (Degrees): 60 Right Knee Extension (Degrees): 0 Additional Comments: (on at 1048)  Post Interventions Patient Tolerated: Well Instructions Provided: Care of device  Trinna PostMartinez, Phelix Fudala J 08/22/2017, 6:23 AM

## 2017-08-22 NOTE — Progress Notes (Signed)
Physical Therapy Note  Clinical impression: Patient tolerated incr mobility (including stair training) well. She was fatigued after walking 40 ft and unable to continue. She wants to wait until she has had all her therapy today to decide if she feels she can  Go home today.    08/22/17 1038  PT Visit Information  Last PT Received On 08/22/17  Assistance Needed +1  History of Present Illness Pt is a 77 y/o female s/p elective R TKA. PMH includes DM and HTN.   Precautions  Precautions Knee  Precaution Booklet Issued Yes (comment)  Precaution Comments Reviewed knee precautions and supine HEP.   Required Braces or Orthoses  (KI d/c'd per order)  Knee Immobilizer - Right Other (comment) (discontinue POD 1)  Restrictions  Weight Bearing Restrictions Yes  RLE Weight Bearing WBAT  Pain Assessment  Pain Assessment Faces  Pain Score  (pt stating an "8" but states "not much")  Faces Pain Scale 2  Pain Location R knee   Pain Descriptors / Indicators Aching;Operative site guarding  Pain Intervention(s) Limited activity within patient's tolerance;Monitored during session;Premedicated before session;Repositioned  Cognition  Arousal/Alertness Awake/alert  Behavior During Therapy WFL for tasks assessed/performed  Overall Cognitive Status Within Functional Limits for tasks assessed  Transfers  Overall transfer level Needs assistance  Equipment used Rolling walker (2 wheeled)  Transfers Sit to/from Stand  Sit to Stand Min assist;Min guard  General transfer comment Min A for steadying RW when she wanted one hand on RW; with repetition able to push up both hands on surface and then minguard assist.  Ambulation/Gait  Ambulation/Gait assistance Min guard  Ambulation Distance (Feet) 40 Feet (15)  Assistive device Rolling walker (2 wheeled)  Gait Pattern/deviations Step-through pattern;Decreased step length - left;Decreased step length - right;Decreased weight shift to right;Antalgic  General  Gait Details required vc initially for sequencing; progressed to no cues however close guarding for safety  Gait velocity Decreased   Stairs Yes  Stairs assistance Min assist  Stair Management One rail Left;Step to pattern;With cane  Number of Stairs 2  General stair comments repeated x2 per pt request; needed vc with descent each time as tries to lead down with "good" leg; Daughter present and able to state proper sequence  Balance  Overall balance assessment Needs assistance  Sitting-balance support No upper extremity supported;Feet supported  Sitting balance-Leahy Scale Good  Standing balance support Bilateral upper extremity supported;During functional activity  Standing balance-Leahy Scale Poor  Standing balance comment Reliant on  single UE support.   Exercises  Exercises Total Joint  Total Joint Exercises  Ankle Circles/Pumps AROM;Both;20 reps  Long [employer] Quad AROM;Strengthening;Right;5 reps;Seated  Knee Flexion AROM;Right;Seated (3 reps x 3 sec each)  PT - End of Session  Equipment Utilized During Treatment Gait belt  Activity Tolerance Patient tolerated treatment well  Patient left in chair;with call bell/phone within reach;with family/visitor present  PT Assessment  PT Visit Diagnosis Unsteadiness on feet (R26.81);Other abnormalities of gait and mobility (R26.89);Pain  Pain - Right/Left Right  Pain - part of body Knee  PT Plan  PT Frequency (ACUTE ONLY) 7X/week  AM-PAC PT "6 Clicks" Daily Activity Outcome Measure  Difficulty turning over in bed (including adjusting bedclothes, sheets and blankets)? 3  Difficulty moving from lying on back to sitting on the side of the bed?  3  Difficulty sitting down on and standing up from a chair with arms (e.g., wheelchair, bedside commode, etc,.)? 3  Help needed moving to and from a bed  to chair (including a wheelchair)? 3  Help needed walking in hospital room? 3  Help needed climbing 3-5 steps with a railing?  3  6 Click Score 18   Mobility G Code  CK  PT Recommendation  Follow Up Recommendations Follow surgeon's recommendation for DC plan and follow-up therapies;Supervision for mobility/OOB  PT equipment None recommended by PT  Acute Rehab PT Goals  Patient Stated Goal to go home   PT Goal Formulation With patient  Time For Goal Achievement 09/04/17  Potential to Achieve Goals Good  PT Time Calculation  PT Start Time (ACUTE ONLY) 7829  PT Stop Time (ACUTE ONLY) 1030  PT Time Calculation (min) (ACUTE ONLY) 38 min  PT General Charges  $$ ACUTE PT VISIT 1 Visit  PT Treatments  $Gait Training 23-37 mins  $Therapeutic Exercise 8-22 mins     Veda Canning, PT [facility] Acute rehab

## 2017-08-22 NOTE — Progress Notes (Signed)
Patient refused dinner time dose of insulin.  Blood sugar 222.  Patient again educated on importance of controlling blood sugar levels.  She continues to state she does not want insulin while in the hosptal

## 2017-08-23 DIAGNOSIS — E119 Type 2 diabetes mellitus without complications: Secondary | ICD-10-CM | POA: Diagnosis not present

## 2017-08-23 DIAGNOSIS — Z79899 Other long term (current) drug therapy: Secondary | ICD-10-CM | POA: Diagnosis not present

## 2017-08-23 DIAGNOSIS — I10 Essential (primary) hypertension: Secondary | ICD-10-CM | POA: Diagnosis not present

## 2017-08-23 DIAGNOSIS — Z9841 Cataract extraction status, right eye: Secondary | ICD-10-CM | POA: Diagnosis not present

## 2017-08-23 DIAGNOSIS — Z87891 Personal history of nicotine dependence: Secondary | ICD-10-CM | POA: Diagnosis not present

## 2017-08-23 DIAGNOSIS — M1711 Unilateral primary osteoarthritis, right knee: Secondary | ICD-10-CM | POA: Diagnosis not present

## 2017-08-23 DIAGNOSIS — M21161 Varus deformity, not elsewhere classified, right knee: Secondary | ICD-10-CM | POA: Diagnosis not present

## 2017-08-23 DIAGNOSIS — Z96651 Presence of right artificial knee joint: Secondary | ICD-10-CM | POA: Diagnosis not present

## 2017-08-23 DIAGNOSIS — M62461 Contracture of muscle, right lower leg: Secondary | ICD-10-CM | POA: Diagnosis not present

## 2017-08-23 DIAGNOSIS — Z7982 Long term (current) use of aspirin: Secondary | ICD-10-CM | POA: Diagnosis not present

## 2017-08-23 LAB — CBC
HEMATOCRIT: 37.2 % (ref 36.0–46.0)
Hemoglobin: 11.5 g/dL — ABNORMAL LOW (ref 12.0–15.0)
MCH: 24.4 pg — ABNORMAL LOW (ref 26.0–34.0)
MCHC: 30.9 g/dL (ref 30.0–36.0)
MCV: 78.8 fL (ref 78.0–100.0)
PLATELETS: 276 10*3/uL (ref 150–400)
RBC: 4.72 MIL/uL (ref 3.87–5.11)
RDW: 14.8 % (ref 11.5–15.5)
WBC: 12.3 10*3/uL — AB (ref 4.0–10.5)

## 2017-08-23 NOTE — Plan of Care (Signed)
  Problem: Education: Goal: Knowledge of General Education information will improve Outcome: Progressing   

## 2017-08-23 NOTE — Progress Notes (Signed)
Orthopedic Trauma Service Progress Note weekend coverage   Patient ID: DARRIN APODACA MRN: [id number] DOB/AGE: 1940-05-30 77 y.o.  Subjective:  Patient is doing fantastic Pain is well controlled with oral pain medication  no complaints Ready to go home today  She is already been in the CPM for about an hour today and has been sitting up in the chair for about 45 minutes   Review of Systems  Constitutional: Negative for chills and fever.  Respiratory: Negative for shortness of breath and wheezing.   Cardiovascular: Negative for chest pain and palpitations.  Gastrointestinal: Negative for abdominal pain, nausea and vomiting.  Neurological: Negative for tingling and sensory change.    Objective:   VITALS:   Vitals:   08/22/17 1456 08/22/17 2052 08/22/17 2334 08/23/17 0348  BP: (!) 123/56 126/69 128/66 136/67  Pulse: (!) 58 76 74 70  Resp: 16 16 17 16   Temp: 97.8 F (36.6 C) 97.8 F (36.6 C) 98.7 F (37.1 C) 99.3 F (37.4 C)  TempSrc: Oral Oral Oral Oral  SpO2: 97% 99% 97% 98%  Weight:        Estimated body mass index is 34.96 kg/m as calculated from the following:   Height as of 08/11/17: 5\' 1"  (1.549 m).   Weight as of this encounter: 83.9 kg (185 lb).   Intake/Output      06/08 0701 - 06/09 0700 06/09 0701 - 06/10 0700   P.O. 720 120   I.V. (mL/kg) 1786.3 (21.3)    Total Intake(mL/kg) 2506.3 (29.9) 120 (1.4)   Urine (mL/kg/hr)     Total Output     Net +2506.3 +120        Urine Occurrence 2 x    Stool Occurrence 1 x      LABS  Results for orders placed or performed during the hospital encounter of 08/21/17 (from the past 24 hour(s))  Glucose, capillary     Status: Abnormal   Collection Time: 08/22/17 11:18 AM  Result Value Ref Range   Glucose-Capillary 161 (H) 65 - 99 mg/dL  Glucose, capillary     Status: Abnormal   Collection Time: 08/22/17  4:29 PM  Result Value Ref Range   Glucose-Capillary 222 (H) 65 - 99 mg/dL   Glucose, capillary     Status: Abnormal   Collection Time: 08/22/17  9:21 PM  Result Value Ref Range   Glucose-Capillary 171 (H) 65 - 99 mg/dL  CBC     Status: Abnormal   Collection Time: 08/23/17  4:16 AM  Result Value Ref Range   WBC 12.3 (H) 4.0 - 10.5 K/uL   RBC 4.72 3.87 - 5.11 MIL/uL   Hemoglobin 11.5 (L) 12.0 - 15.0 g/dL   HCT 40.9 81.1 - 91.4 %   MCV 78.8 78.0 - 100.0 fL   MCH 24.4 (L) 26.0 - 34.0 pg   MCHC 30.9 30.0 - 36.0 g/dL   RDW 78.2 95.6 - 21.3 %   Platelets 276 150 - 400 K/uL     PHYSICAL EXAM:   Gen: Sitting in bedside chair, no acute distress, appears well Lungs: Clear to auscultation bilaterally Cardiac: Regular rate and rhythm, S1 and S2 Abd:+ Bowel sounds, nontender, nondistended Ext:       Right lower extremity  Dressing was removed, incision looks fantastic, no drainage    New mepilex applied   Extremity is warm,+ DP pulse  Swelling is well controlled  Distal motor and sensory functions intact   Excellent EHL and ankle extension  Excellent ankle flexion    Good quad set so far   No DCT  Compartments are soft  No pain with passive stretch     Assessment/Plan: 2 Days Post-Op   Principal Problem:   Primary localized osteoarthritis of right knee Active Problems:   Hypertension   Diabetes mellitus without complication (HCC)   Anti-infectives (From admission, onward)   Start     Dose/Rate Route Frequency Ordered Stop   08/21/17 2000  vancomycin (VANCOCIN) IVPB 1000 mg/200 mL premix     1,000 mg 200 mL/hr over 60 Minutes Intravenous Every 12 hours 08/21/17 1414 08/21/17 2123   08/21/17 0800  vancomycin (VANCOCIN) IVPB 1000 mg/200 mL premix     1,000 mg 200 mL/hr over 60 Minutes Intravenous  Once 08/21/17 0753 08/21/17 0900   08/21/17 0600  ceFAZolin (ANCEF) IVPB 2g/100 mL premix     2 g 200 mL/hr over 30 Minutes Intravenous To ShortStay Surgical 08/20/17 1314 08/21/17 0753    .  POD/HD#: 2  77 y/o female s/p R TKA   -Endstage DJD  R knee s/p R TKA             WBAT              PT/OT             CPM, ROM as tolerated             Ice PRN              Dc home today   Dressing changed today    mepilex   Will have thigh high ted applied before dc   Ok to remove at night and then put back on in am before getting up for the day    - Pain management:             Tylenol              Oxycodone    - ABL anemia/Hemodynamics             Stable   - Medical issues              Home meds   - DVT/PE prophylaxis:             ASA 325 mg po daily    - Activity:             WBAT              Up with assistance    - Dispo:             PT/OT   Dc home today            Anticipated LOS equal to or greater than 2 midnights due to - Age 77 and older with one or more of the following:  - Obesity  - Expected need for hospital services (PT, OT, Nursing) required for safe  discharge  - Anticipated need for postoperative skilled nursing care or inpatient rehab  - Active co-morbidities: Diabetes OR   - Unanticipated findings during/Post Surgery: Slow post-op progression: GI, pain control, mobility  - Patient is a high risk of re-admission due to: None   Weightbearing: WBAT RLE Insicional and dressing care: OK to remove dressings 08/24/2017 and leave open to air with dry gauze PRN Orthopedic device(s): None and walker Showering: ok to shower and clean wounds with soap and water once wounds stop draining  VTE prophylaxis: aspirin 325 mg daily x  4 weeks minimum  Pain control: tylenol and oxy IR  Follow - up plan: 2 weeks Contact information:  Frederico Hamman MD, Josh Chadwell PA-C     [name], PA-C [facility] [phone] Traci Sermon (C) 08/23/2017, 10:26 AM

## 2017-08-23 NOTE — Plan of Care (Signed)
  Problem: Education: Goal: Knowledge of General Education information will improve 08/23/2017 1117 by Darreld Mcleanox, Haedyn Breau, RN Outcome: Adequate for Discharge 08/23/2017 1037 by Darreld Mcleanox, Jarmon Javid, RN Outcome: Progressing

## 2017-08-23 NOTE — Discharge Summary (Signed)
[facility] (OTS)  Patient ID: Kelly Golden MRN: [id number] DOB/AGE: 1940/12/06 77 y.o.  Admit date: 08/21/2017 Discharge date: 08/23/2017  Admission Diagnoses: End-stage DJD right knee Hypertension Diabetes  Discharge Diagnoses:  Principal Problem:   Primary localized osteoarthritis of right knee Active Problems:   Hypertension   Diabetes mellitus without complication [facility])   Past Medical History:  Diagnosis Date  . Arthritis   . Cataracts, both eyes    RIGHT GREATER THAN LEFT  . Diabetes mellitus without complication (HCC)    on no meds, diet control  . Hypertension   . Incontinence   . Seasonal allergies   . Seizures [facility])    as a young child     Procedures Performed: 08/21/2017- Dr. Madelon Lips Right TKA    Discharged Condition: good  Hospital Course:  77 year old black female admitted on 08/21/2017 for right total knee arthroplasty for end-stage DJD of her right knee.  Patient tolerated surgery well after surgery she was transferred to the PACU for recovery from anesthesia and then transferred to the orthopedic floor for observation, pain control and therapies.  Patient's hospital stay was really uncomplicated.  There was some hope that she would be able to discharge on postoperative day #1 however her pain precluded this.  She was still requiring IV pain medication to achieve adequate analgesia.  She worked very diligently with PT and OT during her hospital stay.  She started with therapies on postoperative day #1.  She was covered with antibiotics for routine perioperative coverage and was started on aspirin for DVT and PE prophylaxis.  Patient continued to progress well over the next 24 hours and on postoperative day #2 she was deemed to be stable for discharge home with home health PT and necessary durable medical equipment.  Patient discharged on 08/23/2017 in stable condition.  Her dressings were changed prior to her discharge.  Her wounds look  fantastic there was no drainage noted.  TED hose were placed prior to discharge as well for swelling control as well as mechanical DVT prophylaxis.  Patient will follow-up with her surgeon in 10 to 14 days for reevaluation.  Consults: None  Significant Diagnostic Studies: labs:   Results for RAMESHA, POSTER (MRN [id number]) as of 08/23/2017 10:49  Ref. Range 08/23/2017 04:16  WBC Latest Ref Range: 4.0 - 10.5 K/uL 12.3 (H)  RBC Latest Ref Range: 3.87 - 5.11 MIL/uL 4.72  Hemoglobin Latest Ref Range: 12.0 - 15.0 g/dL 78.2 (L)  HCT Latest Ref Range: 36.0 - 46.0 % 37.2  MCV Latest Ref Range: 78.0 - 100.0 fL 78.8  MCH Latest Ref Range: 26.0 - 34.0 pg 24.4 (L)  MCHC Latest Ref Range: 30.0 - 36.0 g/dL 95.6  RDW Latest Ref Range: 11.5 - 15.5 % 14.8  Platelets Latest Ref Range: 150 - 400 K/uL 276   CBG (last 3)  Recent Labs    08/22/17 1118 08/22/17 1629 08/22/17 2121  GLUCAP 161* 222* 171*     Treatments: IV hydration, antibiotics: Ancef and vancomycin, analgesia: acetaminophen, Dilaudid and oxy IR, anticoagulation: ASA, therapies: PT, OT and RN and surgery: as above   Discharge Exam:      Orthopedic Trauma Service Progress Note weekend coverage    Patient ID: Kelly Golden MRN: [id number] DOB/AGE: June 14, 1940 77 y.o.   Subjective:   Patient is doing fantastic Pain is well controlled with oral pain medication  no complaints Ready to go home today   She is already been in the CPM  for about an hour today and has been sitting up in the chair for about 45 minutes     Review of Systems  Constitutional: Negative for chills and fever.  Respiratory: Negative for shortness of breath and wheezing.   Cardiovascular: Negative for chest pain and palpitations.  Gastrointestinal: Negative for abdominal pain, nausea and vomiting.  Neurological: Negative for tingling and sensory change.      Objective:    VITALS:         Vitals:    08/22/17 1456 08/22/17 2052 08/22/17 2334  08/23/17 0348  BP: (!) 123/56 126/69 128/66 136/67  Pulse: (!) 58 76 74 70  Resp: 16 16 17 16   Temp: 97.8 F (36.6 C) 97.8 F (36.6 C) 98.7 F (37.1 C) 99.3 F (37.4 C)  TempSrc: Oral Oral Oral Oral  SpO2: 97% 99% 97% 98%  Weight:              Estimated body mass index is 34.96 kg/m as calculated from the following:   Height as of 08/11/17: 5\' 1"  (1.549 m).   Weight as of this encounter: 83.9 kg (185 lb).     Intake/Output      06/08 0701 - 06/09 0700 06/09 0701 - 06/10 0700   P.O. 720 120   I.V. (mL/kg) 1786.3 (21.3)    Total Intake(mL/kg) 2506.3 (29.9) 120 (1.4)   Urine (mL/kg/hr)     Total Output     Net +2506.3 +120        Urine Occurrence 2 x    Stool Occurrence 1 x       LABS   Lab Results Last 24 Hours       Results for orders placed or performed during the hospital encounter of 08/21/17 (from the past 24 hour(s))  Glucose, capillary     Status: Abnormal    Collection Time: 08/22/17 11:18 AM  Result Value Ref Range    Glucose-Capillary 161 (H) 65 - 99 mg/dL  Glucose, capillary     Status: Abnormal    Collection Time: 08/22/17  4:29 PM  Result Value Ref Range    Glucose-Capillary 222 (H) 65 - 99 mg/dL  Glucose, capillary     Status: Abnormal    Collection Time: 08/22/17  9:21 PM  Result Value Ref Range    Glucose-Capillary 171 (H) 65 - 99 mg/dL  CBC     Status: Abnormal    Collection Time: 08/23/17  4:16 AM  Result Value Ref Range    WBC 12.3 (H) 4.0 - 10.5 K/uL    RBC 4.72 3.87 - 5.11 MIL/uL    Hemoglobin 11.5 (L) 12.0 - 15.0 g/dL    HCT 19.1 47.8 - 29.5 %    MCV 78.8 78.0 - 100.0 fL    MCH 24.4 (L) 26.0 - 34.0 pg    MCHC 30.9 30.0 - 36.0 g/dL    RDW 62.1 30.8 - 65.7 %    Platelets 276 150 - 400 K/uL          PHYSICAL EXAM:    Gen: Sitting in bedside chair, no acute distress, appears well Lungs: Clear to auscultation bilaterally Cardiac: Regular rate and rhythm, S1 and S2 Abd:+ Bowel sounds, nontender, nondistended Ext:       Right lower  extremity             Dressing was removed, incision looks fantastic, no drainage  New mepilex applied              Extremity is warm,+ DP pulse             Swelling is well controlled             Distal motor and sensory functions intact                         Excellent EHL and ankle extension                          Excellent ankle flexion                          Good quad set so far              No DCT             Compartments are soft             No pain with passive stretch                 Assessment/Plan: 2 Days Post-Op    Principal Problem:   Primary localized osteoarthritis of right knee Active Problems:   Hypertension   Diabetes mellitus without complication (HCC)                Anti-infectives (From admission, onward)    Start     Dose/Rate Route Frequency Ordered Stop    08/21/17 2000   vancomycin (VANCOCIN) IVPB 1000 mg/200 mL premix     1,000 mg 200 mL/hr over 60 Minutes Intravenous Every 12 hours 08/21/17 1414 08/21/17 2123    08/21/17 0800   vancomycin (VANCOCIN) IVPB 1000 mg/200 mL premix     1,000 mg 200 mL/hr over 60 Minutes Intravenous  Once 08/21/17 0753 08/21/17 0900    08/21/17 0600   ceFAZolin (ANCEF) IVPB 2g/100 mL premix     2 g 200 mL/hr over 30 Minutes Intravenous To ShortStay Surgical 08/20/17 1314 08/21/17 0753     .   POD/HD#: 2   77 y/o female s/p R TKA   -Endstage DJD R knee s/p R TKA             WBAT              PT/OT             CPM, ROM as tolerated             Ice PRN              Dc home today               Dressing changed today                          mepilex                         Will have thigh high ted applied before dc                         Ok to remove at night and then put back on in am before getting up for the day    - Pain management:             Tylenol  Oxycodone    - ABL anemia/Hemodynamics             Stable   - Medical issues              Home  meds   - DVT/PE prophylaxis:             ASA 325 mg po daily    - Activity:             WBAT              Up with assistance    - Dispo:             PT/OT              Dc home today      Anticipated LOS equal to or greater than 2 midnights due to - Age 70 and older with one or more of the following:             - Obesity             - Expected need for hospital services (PT, OT, Nursing) required for safe   discharge             - Anticipated need for postoperative skilled nursing care or inpatient rehab             - Active co-morbidities: Diabetes OR    - Unanticipated findings during/Post Surgery: Slow post-op progression: GI, pain control, mobility  - Patient is a high risk of re-admission due to: None     Weightbearing: WBAT RLE Insicional and dressing care: OK to remove dressings 08/24/2017 and leave open to air with dry gauze PRN Orthopedic device(s): None and walker Showering: ok to shower and clean wounds with soap and water once wounds stop draining  VTE prophylaxis: aspirin 325 mg daily x 4 weeks minimum  Pain control: tylenol and oxy IR  Follow - up plan: 2 weeks Contact information:  Frederico Hamman MD, Josh Chadwell PA-C     Disposition: Discharge disposition: 01-Home or Self Care       Discharge Instructions    CPM   Complete by:  As directed    Continuous passive motion machine (CPM):      Use the CPM from 0 to 90 for 4-6 hours per day.      You may increase by 5 degrees per day if not able to get to 90 degrees.  You may break it up into 2 or 3 sessions per day.      Use CPM for 4 weeks or until you are told to stop.   Call MD / Call 911   Complete by:  As directed    If you experience chest pain or shortness of breath, CALL 911 and be transported to the hospital emergency room.  If you develope a fever above 101 F, pus (white drainage) or increased drainage or redness at the wound, or calf pain, call your surgeon's office.   Constipation Prevention    Complete by:  As directed    Drink plenty of fluids.  Prune juice may be helpful.  You may use a stool softener, such as Colace (over the counter) 100 mg twice a day.  Use MiraLax (over the counter) for constipation as needed.   Diet Carb Modified   Complete by:  As directed    Discharge instructions   Complete by:  As directed    INSTRUCTIONS AFTER JOINT REPLACEMENT  Remove items at home which could result in a fall. This includes throw rugs or furniture in walking pathways ICE to the affected joint every three hours while awake for 30 minutes at a time, for at least the first 3-5 days, and then as needed for pain and swelling.  Continue to use ice for pain and swelling. You may notice swelling that will progress down to the foot and ankle.  This is normal after surgery.  Elevate your leg when you are not up walking on it.   Continue to use the breathing machine you got in the hospital (incentive spirometer) which will help keep your temperature down.  It is common for your temperature to cycle up and down following surgery, especially at night when you are not up moving around and exerting yourself.  The breathing machine keeps your lungs expanded and your temperature down.   DIET:  As you were doing prior to hospitalization, we recommend a well-balanced diet.  DRESSING / WOUND CARE / SHOWERING  You may change your dressing 3-5 days after surgery.  Then change the dressing every day with sterile gauze.  Please use good hand washing techniques before changing the dressing.  Do not use any lotions or creams on the incision until instructed by your surgeon.  ACTIVITY  Increase activity slowly as tolerated, but follow the weight bearing instructions below.   No driving for 6 weeks or until further direction given by your physician.  You cannot drive while taking narcotics.  No lifting or carrying greater than 10 lbs. until further directed by your surgeon. Avoid periods of inactivity such  as sitting longer than an hour when not asleep. This helps prevent blood clots.  You may return to work once you are authorized by your doctor.     WEIGHT BEARING   Weight bearing as tolerated with assist device (walker, cane, etc) as directed, use it as long as suggested by your surgeon or therapist, typically at least 4-6 weeks.   EXERCISES  Results after joint replacement surgery are often greatly improved when you follow the exercise, range of motion and muscle strengthening exercises prescribed by your doctor. Safety measures are also important to protect the joint from further injury. Any time any of these exercises cause you to have increased pain or swelling, decrease what you are doing until you are comfortable again and then slowly increase them. If you have problems or questions, call your caregiver or physical therapist for advice.   Rehabilitation is important following a joint replacement. After just a few days of immobilization, the muscles of the leg can become weakened and shrink (atrophy).  These exercises are designed to build up the tone and strength of the thigh and leg muscles and to improve motion. Often times heat used for twenty to thirty minutes before working out will loosen up your tissues and help with improving the range of motion but do not use heat for the first two weeks following surgery (sometimes heat can increase post-operative swelling).   These exercises can be done on a training (exercise) mat, on the floor, on a table or on a bed. Use whatever works the best and is most comfortable for you.    Use music or television while you are exercising so that the exercises are a pleasant break in your day. This will make your life better with the exercises acting as a break in your routine that you can look forward to.   Perform all exercises  about fifteen times, three times per day or as directed.  You should exercise both the operative leg and the other leg as  well.  Exercises include:   Quad Sets - Tighten up the muscle on the front of the thigh (Quad) and hold for 5-10 seconds.   Straight Leg Raises - With your knee straight (if you were given a brace, keep it on), lift the leg to 60 degrees, hold for 3 seconds, and slowly lower the leg.  Perform this exercise against resistance later as your leg gets stronger.  Leg Slides: Lying on your back, slowly slide your foot toward your buttocks, bending your knee up off the floor (only go as far as is comfortable). Then slowly slide your foot back down until your leg is flat on the floor again.  Angel Wings: Lying on your back spread your legs to the side as far apart as you can without causing discomfort.  Hamstring Strength:  Lying on your back, push your heel against the floor with your leg straight by tightening up the muscles of your buttocks.  Repeat, but this time bend your knee to a comfortable angle, and push your heel against the floor.  You may put a pillow under the heel to make it more comfortable if necessary.   A rehabilitation program following joint replacement surgery can speed recovery and prevent re-injury in the future due to weakened muscles. Contact your doctor or a physical therapist for more information on knee rehabilitation.    CONSTIPATION  Constipation is defined medically as fewer than three stools per week and severe constipation as less than one stool per week.  Even if you have a regular bowel pattern at home, your normal regimen is likely to be disrupted due to multiple reasons following surgery.  Combination of anesthesia, postoperative narcotics, change in appetite and fluid intake all can affect your bowels.   YOU MUST use at least one of the following options; they are listed in order of increasing strength to get the job done.  They are all available over the counter, and you may need to use some, POSSIBLY even all of these options:    Drink plenty of fluids (prune juice  may be helpful) and high fiber foods Colace 100 mg by mouth twice a day  Senokot for constipation as directed and as needed Dulcolax (bisacodyl), take with full glass of water  Miralax (polyethylene glycol) once or twice a day as needed.  If you have tried all these things and are unable to have a bowel movement in the first 3-4 days after surgery call either your surgeon or your primary doctor.    If you experience loose stools or diarrhea, hold the medications until you stool forms back up.  If your symptoms do not get better within 1 week or if they get worse, check with your doctor.  If you experience "the worst abdominal pain ever" or develop nausea or vomiting, please contact the office immediately for further recommendations for treatment.   ITCHING:  If you experience itching with your medications, try taking only a single pain pill, or even half a pain pill at a time.  You can also use Benadryl over the counter for itching or also to help with sleep.   TED HOSE STOCKINGS:  Use stockings on both legs until for at least 2 weeks or as directed by physician office. They may be removed at night for sleeping.  MEDICATIONS:  See your medication summary  on the "After Visit Summary" that nursing will review with you.  You may have some home medications which will be placed on hold until you complete the course of blood thinner medication.  It is important for you to complete the blood thinner medication as prescribed.  PRECAUTIONS:  If you experience chest pain or shortness of breath - call 911 immediately for transfer to the hospital emergency department.   If you develop a fever greater that 101 F, purulent drainage from wound, increased redness or drainage from wound, foul odor from the wound/dressing, or calf pain - CONTACT YOUR SURGEON.                                                   FOLLOW-UP APPOINTMENTS:  If you do not already have a post-op appointment, please call the office for an  appointment to be seen by your surgeon.  Guidelines for how soon to be seen are listed in your "After Visit Summary", but are typically between 1-4 weeks after surgery.  OTHER INSTRUCTIONS:   Knee Replacement:  Do not place pillow under knee, focus on keeping the knee straight while resting. CPM instructions: 0-90 degrees, 2 hours in the morning, 2 hours in the afternoon, and 2 hours in the evening. Place foam block, curve side up under heel at all times except when in CPM or when walking.  DO NOT modify, tear, cut, or change the foam block in any way.  MAKE SURE YOU:  Understand these instructions.  Get help right away if you are not doing well or get worse.    Thank you for letting us be a part of your medical care team.  It is a privilege we respect greatly.  We hope these instructions will help you stay on track for a fast and full recovery!   Do not put a pillow under the knee. Place it under the heel.   Complete by:  As directed    Use blue roll to elevate heel above knee to encourage the knee to be completely straight when in bed or in chair   Driving restrictions   Complete by:  As directed    No driving   Increase activity slowly as tolerated   Complete by:  As directed    Weight bearing as tolerated   Complete by:  As directed    Laterality:  right   Extremity:  Lower     Allergies as of 08/23/2017   No Known Allergies     Medication List    TAKE these medications   acetaminophen 500 MG tablet Commonly known as:  TYLENOL Take 1,000 mg by mouth daily as needed for moderate pain.   aspirin EC 325 MG tablet Take 1 tablet (325 mg total) by mouth daily. For 30 days   CARTIA XT 240 MG 24 hr capsule Generic drug:  diltiazem Take 240 mg by mouth daily.   COMBIGAN 0.2-0.5 % ophthalmic solution Generic drug:  brimonidine-timolol Place 1 drop into both eyes 2 (two) times daily.   fexofenadine 180 MG tablet Commonly known as:  ALLEGRA Take 180 mg by mouth daily as needed  for allergies or rhinitis.   MYRBETRIQ 25 MG Tb24 tablet Generic drug:  mirabegron ER Take 25 mg by mouth daily.   oxyCODONE 5 MG immediate release tablet Commonly known as:  Oxy IR/ROXICODONE 1 tab po q4-6hrs prn pain, may have to take 1-2 tabs for the first few weeks   spironolactone 25 MG tablet Commonly known as:  ALDACTONE Take 25 mg by mouth 2 (two) times daily.            Discharge Care Instructions  (From admission, onward)        Start     Ordered   08/23/17 0000  Weight bearing as tolerated    Question Answer Comment  Laterality right   Extremity Lower      08/23/17 1040     Follow-up Information    Frederico Hamman, MD. Schedule an appointment as soon as possible for a visit in 2 weeks.   Specialty:  Orthopedic Surgery Contact information: [address] [place] [address] [phone]        Home, [facility] Follow up.   Specialty:  Home Health Services Why:  A representative from [facility] will contact you to arrange start date and time for your therapy. Contact information: [address] [place] [address] [phone]           Discharge Instructions and Plan:  77 y/o female s/p R TKA   -Endstage DJD R knee s/p R TKA             WBAT              PT/OT             CPM, ROM as tolerated             Ice PRN              Dc home today               Dressing changed today                          mepilex                         Will have thigh high ted applied before dc                         Ok to remove at night and then put back on in am before getting up for the day    - Pain management:             Tylenol              Oxycodone    - ABL anemia/Hemodynamics             Stable   - Medical issues              Home meds   - DVT/PE prophylaxis:             ASA 325 mg po daily    - Activity:             WBAT              Up with assistance    - Dispo:             PT/OT               Dc home today      Anticipated LOS equal to or greater than 2 midnights due to - Age 2 and older with one or  more of the following:             - Obesity             - Expected need for hospital services (PT, OT, Nursing) required for safe   discharge             - Anticipated need for postoperative skilled nursing care or inpatient rehab             - Active co-morbidities: Diabetes OR    - Unanticipated findings during/Post Surgery: Slow post-op progression: GI, pain control, mobility  - Patient is a high risk of re-admission due to: None     Weightbearing: WBAT RLE Insicional and dressing care: OK to remove dressings 08/24/2017 and leave open to air with dry gauze PRN Orthopedic device(s): None and walker Showering: ok to shower and clean wounds with soap and water once wounds stop draining  VTE prophylaxis: aspirin 325 mg daily x 4 weeks minimum  Pain control: tylenol and oxy IR  Follow - up plan: 2 weeks Contact information:  Frederico Hammananiel Caffrey MD, Estanislado SpireJosh Chadwell PA-C     Signed:  Mearl LatinKeith W. Dinah Lupa, PA-C [facility] [phone] (P) 08/23/2017, 10:42 AM

## 2017-08-23 NOTE — Progress Notes (Signed)
Occupational Therapy Treatment Patient Details Name: Kelly Golden MRN: [id number] DOB: 11/15/1940 Today's Date: 08/23/2017    History of present illness Pt is a 77 y/o female s/p elective R TKA. PMH includes DM and HTN.    OT comments  Pt. In bed on CPM upon arrival.  Agreeable to participation in skilled OT.  Pt. Able to complete bed mobility with S. Amb. To recliner.  Discussed LB dressing options and pt. Reports family to assist.  Also reviewed tub transfer.  At this time pt. States she will sponge bathe and did not want to pursue tub transfer.  Reports strong family support with 24/7 assistance.    Follow Up Recommendations  Follow surgeon's recommendation for DC plan and follow-up therapies;Supervision/Assistance - 24 hour    Equipment Recommendations  None recommended by OT    Recommendations for Other Services      Precautions / Restrictions Precautions Precautions: Knee Restrictions RLE Weight Bearing: Weight bearing as tolerated       Mobility Bed Mobility Overal bed mobility: Needs Assistance Bed Mobility: Supine to Sit     Supine to sit: Supervision     General bed mobility comments: hob flat, no rails or use of trapeze. pt. able to come into semi-long sitting and bring bles out of bed without physical assistance  Transfers Overall transfer level: Needs assistance Equipment used: Rolling walker (2 wheeled) Transfers: Sit to/from [employer] Sit to Stand: Min assist Stand pivot transfers: Min guard       General transfer comment: Min Guard A for safety. VCs for hand placement and slowing down    Balance                                           ADL either performed or assessed with clinical judgement   ADL Overall ADL's : Needs assistance/impaired               Lower Body Bathing Details (indicate cue type and reason): reports she has 24/7 assistance from husband and multiple family members        Lower Body Dressing Details (indicate cue type and reason): reviewed LB dressing tech.  she states she usually uses L LE to asssit with RLE.  also demonstrated ability to bend forward in seated position to reach feet. does not feel she needs A/E as she reports she will have her g.dtrs. available for entire summer to assist as needed plus husband also available  Toilet Transfer: Min IT sales professional Details (indicate cue type and reason): simulated with eob amb. to recliner. pt. declined amb. to b.room "lets just start with this for now"       Tub/Shower Transfer Details (indicate cue type and reason): pt. declined tub transfer. states she wants to sponge bathe until she feels she could bend knee to clear height of tub and also bear weight on RLE during transfer in/out of tub Functional mobility during ADLs: Min guard;Rolling walker General ADL Comments: pt. reports she lives with family members on either side of her home and behind her also. states g.dtrs available and also husband. strong family support 24/7     Vision       Perception     Praxis      Cognition Arousal/Alertness: Awake/alert Behavior During Therapy: WFL for tasks assessed/performed Overall Cognitive Status: Within Functional Limits for tasks assessed  Exercises     Shoulder Instructions       General Comments      Pertinent Vitals/ Pain       Pain Assessment: 0-10 Pain Score: 8  Pain Location: r knee Pain Descriptors / Indicators: Aching Pain Intervention(s): Limited activity within patient's tolerance;Monitored during session;Repositioned  Home Living                                          Prior Functioning/Environment              Frequency  Min 2X/week        Progress Toward Goals  OT Goals(current goals can now be found in the care plan section)  Progress towards OT goals: Progressing  toward goals     Plan      Co-evaluation                 AM-PAC PT "6 Clicks" Daily Activity     Outcome Measure   Help from another person eating meals?: None Help from another person taking care of personal grooming?: A Little Help from another person toileting, which includes using toliet, bedpan, or urinal?: A Little Help from another person bathing (including washing, rinsing, drying)?: A Little Help from another person to put on and taking off regular upper body clothing?: A Little Help from another person to put on and taking off regular lower body clothing?: A Lot 6 Click Score: 18    End of Session Equipment Utilized During Treatment: Gait belt;Rolling walker  OT Visit Diagnosis: Unsteadiness on feet (R26.81);Other abnormalities of gait and mobility (R26.89);Muscle weakness (generalized) (M62.81);Pain Pain - Right/Left: Right Pain - part of body: Knee   Activity Tolerance Patient tolerated treatment well   Patient Left in chair;with call bell/phone within reach   Nurse Communication          Time: [id number] OT Time Calculation (min): 16 min  Charges: OT General Charges $OT Visit: 1 Visit OT Treatments $Self Care/Home Management : 8-22 mins   Robet LeuMorris, Tony Granquist [place], COAT/AL 08/23/2017, 9:25 AM

## 2017-08-23 NOTE — Progress Notes (Signed)
Physical Therapy Treatment Patient Details Name: Kelly Golden MRN: [id number] DOB: 1940-10-04 Today's Date: 08/23/2017    History of Present Illness Pt is a 77 y/o female s/p elective R TKA. PMH includes DM and HTN.     PT Comments    Pt progressing well. Pt fatigues quickly with ambulation. Pt remains to have difficulty isolating R quad contraction for quad set. Acute PT to cont. To follow.   Follow Up Recommendations  Follow surgeon's recommendation for DC plan and follow-up therapies;Supervision for mobility/OOB     Equipment Recommendations  None recommended by PT    Recommendations for Other Services       Precautions / Restrictions Precautions Precautions: Knee Precaution Booklet Issued: Yes (comment) Precaution Comments: Reviewed knee precautions and supine HEP.  Restrictions Weight Bearing Restrictions: Yes RLE Weight Bearing: Weight bearing as tolerated    Mobility  Bed Mobility Overal bed mobility: Needs Assistance Bed Mobility: Supine to Sit     Supine to sit: Supervision     General bed mobility comments: pt up in chair upon PT arrival  Transfers Overall transfer level: Needs assistance Equipment used: Rolling walker (2 wheeled) Transfers: Sit to/from Stand Sit to Stand: Min guard Stand pivot transfers: Min guard       General transfer comment: verbal cues to push up from arm rests, min guard during transion of hands from chair to RW  Ambulation/Gait Ambulation/Gait assistance: Min guard Ambulation Distance (Feet): 120 Feet Assistive device: Rolling walker (2 wheeled) Gait Pattern/deviations: Step-to pattern;Step-through pattern Gait velocity: slow Gait velocity interpretation: <1.31 ft/sec, indicative of household ambulator General Gait Details: pt initially with step to, short gait pattern. pt then transitioned to step through gait pattern however became fatigued and had difficulty maintaining upright posture and demonstrated increased  trunk flexion. pt with SOB.   Stairs             Wheelchair Mobility    Modified Rankin (Stroke Patients Only)       Balance Overall balance assessment: Needs assistance Sitting-balance support: No upper extremity supported;Feet supported Sitting balance-Leahy Scale: Good     Standing balance support: Bilateral upper extremity supported;During functional activity Standing balance-Leahy Scale: Poor Standing balance comment: Reliant on  single UE support.                             Cognition Arousal/Alertness: Awake/alert Behavior During Therapy: WFL for tasks assessed/performed Overall Cognitive Status: Within Functional Limits for tasks assessed                                        Exercises Total Joint Exercises Quad Sets: AROM;Supine;10 reps;Right(difficulty isolating quad muscle) Short Arc Quad: AROM;Right;10 reps Heel Slides: AROM;Right;10 reps;Seated Goniometric ROM: 60 degrees active knee flexion in sitting    General Comments General comments (skin integrity, edema, etc.): pt with dressing on incision, no drainage      Pertinent Vitals/Pain Pain Assessment: 0-10 Pain Score: 7  Pain Location: R knee Pain Descriptors / Indicators: Constant Pain Intervention(s): Monitored during session    Home Living                      Prior Function            PT Goals (current goals can now be found in the care plan section) Acute Rehab  PT Goals Patient Stated Goal: go home Progress towards PT goals: Progressing toward goals    Frequency    7X/week      PT Plan Current plan remains appropriate    Co-evaluation              AM-PAC PT "6 Clicks" Daily Activity  Outcome Measure  Difficulty turning over in bed (including adjusting bedclothes, sheets and blankets)?: A Little Difficulty moving from lying on back to sitting on the side of the bed? : A Little Difficulty sitting down on and standing up from  a chair with arms (e.g., wheelchair, bedside commode, etc,.)?: A Little Help needed moving to and from a bed to chair (including a wheelchair)?: A Little Help needed walking in hospital room?: A Little Help needed climbing 3-5 steps with a railing? : A Little 6 Click Score: 18    End of Session Equipment Utilized During Treatment: Gait belt Activity Tolerance: Patient tolerated treatment well Patient left: in chair;with call bell/phone within reach Nurse Communication: Mobility status PT Visit Diagnosis: Unsteadiness on feet (R26.81);Other abnormalities of gait and mobility (R26.89);Pain Pain - Right/Left: Right Pain - part of body: Knee     Time: [id number] PT Time Calculation (min) (ACUTE ONLY): 25 min  Charges:  $Gait Training: 8-22 mins $Therapeutic Exercise: 8-22 mins                    G Codes:       Lewis ShockAshly Domique Clapper, PT, DPT Pager #: [phone] Office #: [phone]    Erum Cercone M Izik Bingman 08/23/2017, 11:37 AM

## 2017-08-24 ENCOUNTER — Encounter (HOSPITAL_COMMUNITY): Payer: Self-pay | Admitting: Orthopedic Surgery

## 2017-08-24 DIAGNOSIS — Z87891 Personal history of nicotine dependence: Secondary | ICD-10-CM | POA: Diagnosis not present

## 2017-08-24 DIAGNOSIS — I10 Essential (primary) hypertension: Secondary | ICD-10-CM | POA: Diagnosis not present

## 2017-08-24 DIAGNOSIS — E119 Type 2 diabetes mellitus without complications: Secondary | ICD-10-CM | POA: Diagnosis not present

## 2017-08-24 DIAGNOSIS — Z471 Aftercare following joint replacement surgery: Secondary | ICD-10-CM | POA: Diagnosis not present

## 2017-08-24 DIAGNOSIS — Z96651 Presence of right artificial knee joint: Secondary | ICD-10-CM | POA: Diagnosis not present

## 2017-08-24 NOTE — Op Note (Signed)
NAME: Kelly Golden, [name] MEDICAL RECORD [id number] ACCOUNT [id number] DATE OF BIRTH:[birth year] FACILITY: [facility] LOCATION: [facility] PHYSICIAN:W. Whalen Trompeter JR., MD  OPERATIVE REPORT  DATE OF PROCEDURE:  08/21/2017  PREOPERATIVE DIAGNOSES:  Severe osteoarthritis with varus deformity and flexion contracture, right knee.  POSTOPERATIVE DIAGNOSES:  Severe osteoarthritis with varus deformity and flexion contracture, right knee.  PROCEDURE:  Right total knee replacement (Sigma size 3 femur, tibia 15 mm bearing, 35 mm all poly patella).  SURGEON:  W. Su Leyaniel Anacaren Kohan Jr., MD  ASSISTANVincent Peyer:  [place]  ANESTHESIA:  Spinal with adductor block.  TOURNIQUET TIME:  58 minutes.  DESCRIPTION OF PROCEDURE:  A spinal anesthetic, supine positioning, thigh tourniquet exsanguination and inflation to 350.  Straight skin incision with a medial parapatellar approach to the knee was made.  We cut a 5-degree 11 mm valgus cut on the distal  femur, then cut about 4 mm below the most diseased medial compartment.  Pretty significant bone loss was encountered on the tibia.  Once this was done, we measured the extension gap at 15 mm.  Femur was sized to be a size 3.  Placement of all-in-one  cutting block with the appropriate degree of external rotation, cutting the anterior and posterior chamfers.  Attention was next directed at the tibia.  It was sized to be a size 3.  Likewise, the femur was a size 3.  We placed the keel cutting block for the tibia, and then placed the tibial trial through the keel plate with the 15 mm bearing.  We then cut the  box for the femur with the size 3 femur.  Prior to this, small remnants of osteophytes between the posterior medial aspect of the knee were removed with resection of ____ osteophytes off the femur as well.  Complete release of the PCL was carried out as  well.  The patella was cut, leaving about 15 mm of native patella for a 35 mm all poly trial ____.  We did trial with  the final prosthesis in, as well as the trial with the 12.5 and 15 mm inserted on the 15 mm bearing.  Cement was put on the back table,  and the ____ stayed with the antibiotic-impregnated cement due to the patient being staph positive, MRSA negative but also diabetic.  Cement was allowed to harden.  We then infiltrated Exparel and Marcaine with epinephrine into the capsular subcutaneous  tissues.  The tourniquet was released after the cement had hardened.  We then removed the trial bearing and removed some excess cement.  No excessive bleeding was noted.  I placed the final bearing and trialed again with a 12.5, 15 mm instead of on the  15.  Full extension, excellent stability was noted in flexion.  No tendency for bearing spinout.  Closure was affected with #1 Ethibond, 2-0 Vicryl and skin clips.  A light compressive sterile dressing and knee immobilizer applied.  LN/NUANCE  D:08/21/2017 T:08/21/2017 JOB:[id number]

## 2017-08-27 DIAGNOSIS — E119 Type 2 diabetes mellitus without complications: Secondary | ICD-10-CM | POA: Diagnosis not present

## 2017-08-27 DIAGNOSIS — Z87891 Personal history of nicotine dependence: Secondary | ICD-10-CM | POA: Diagnosis not present

## 2017-08-27 DIAGNOSIS — Z471 Aftercare following joint replacement surgery: Secondary | ICD-10-CM | POA: Diagnosis not present

## 2017-08-27 DIAGNOSIS — I10 Essential (primary) hypertension: Secondary | ICD-10-CM | POA: Diagnosis not present

## 2017-08-27 DIAGNOSIS — Z96651 Presence of right artificial knee joint: Secondary | ICD-10-CM | POA: Diagnosis not present

## 2017-08-28 DIAGNOSIS — Z471 Aftercare following joint replacement surgery: Secondary | ICD-10-CM | POA: Diagnosis not present

## 2017-08-28 DIAGNOSIS — E119 Type 2 diabetes mellitus without complications: Secondary | ICD-10-CM | POA: Diagnosis not present

## 2017-08-28 DIAGNOSIS — I10 Essential (primary) hypertension: Secondary | ICD-10-CM | POA: Diagnosis not present

## 2017-08-28 DIAGNOSIS — Z96651 Presence of right artificial knee joint: Secondary | ICD-10-CM | POA: Diagnosis not present

## 2017-08-28 DIAGNOSIS — Z87891 Personal history of nicotine dependence: Secondary | ICD-10-CM | POA: Diagnosis not present

## 2017-08-31 DIAGNOSIS — Z471 Aftercare following joint replacement surgery: Secondary | ICD-10-CM | POA: Diagnosis not present

## 2017-08-31 DIAGNOSIS — Z87891 Personal history of nicotine dependence: Secondary | ICD-10-CM | POA: Diagnosis not present

## 2017-08-31 DIAGNOSIS — I10 Essential (primary) hypertension: Secondary | ICD-10-CM | POA: Diagnosis not present

## 2017-08-31 DIAGNOSIS — E119 Type 2 diabetes mellitus without complications: Secondary | ICD-10-CM | POA: Diagnosis not present

## 2017-08-31 DIAGNOSIS — Z96651 Presence of right artificial knee joint: Secondary | ICD-10-CM | POA: Diagnosis not present

## 2017-09-02 DIAGNOSIS — Z471 Aftercare following joint replacement surgery: Secondary | ICD-10-CM | POA: Diagnosis not present

## 2017-09-02 DIAGNOSIS — Z96651 Presence of right artificial knee joint: Secondary | ICD-10-CM | POA: Diagnosis not present

## 2017-09-02 DIAGNOSIS — E119 Type 2 diabetes mellitus without complications: Secondary | ICD-10-CM | POA: Diagnosis not present

## 2017-09-02 DIAGNOSIS — Z87891 Personal history of nicotine dependence: Secondary | ICD-10-CM | POA: Diagnosis not present

## 2017-09-02 DIAGNOSIS — I10 Essential (primary) hypertension: Secondary | ICD-10-CM | POA: Diagnosis not present

## 2017-09-03 DIAGNOSIS — I10 Essential (primary) hypertension: Secondary | ICD-10-CM | POA: Diagnosis not present

## 2017-09-03 DIAGNOSIS — E119 Type 2 diabetes mellitus without complications: Secondary | ICD-10-CM | POA: Diagnosis not present

## 2017-09-03 DIAGNOSIS — Z87891 Personal history of nicotine dependence: Secondary | ICD-10-CM | POA: Diagnosis not present

## 2017-09-03 DIAGNOSIS — Z471 Aftercare following joint replacement surgery: Secondary | ICD-10-CM | POA: Diagnosis not present

## 2017-09-03 DIAGNOSIS — M1711 Unilateral primary osteoarthritis, right knee: Secondary | ICD-10-CM | POA: Diagnosis not present

## 2017-09-03 DIAGNOSIS — Z96651 Presence of right artificial knee joint: Secondary | ICD-10-CM | POA: Diagnosis not present

## 2017-09-09 DIAGNOSIS — Z471 Aftercare following joint replacement surgery: Secondary | ICD-10-CM | POA: Diagnosis not present

## 2017-09-09 DIAGNOSIS — R262 Difficulty in walking, not elsewhere classified: Secondary | ICD-10-CM | POA: Diagnosis not present

## 2017-09-09 DIAGNOSIS — M1711 Unilateral primary osteoarthritis, right knee: Secondary | ICD-10-CM | POA: Diagnosis not present

## 2017-09-09 DIAGNOSIS — M25561 Pain in right knee: Secondary | ICD-10-CM | POA: Diagnosis not present

## 2017-09-16 DIAGNOSIS — Z471 Aftercare following joint replacement surgery: Secondary | ICD-10-CM | POA: Diagnosis not present

## 2017-09-16 DIAGNOSIS — R262 Difficulty in walking, not elsewhere classified: Secondary | ICD-10-CM | POA: Diagnosis not present

## 2017-09-16 DIAGNOSIS — M1711 Unilateral primary osteoarthritis, right knee: Secondary | ICD-10-CM | POA: Diagnosis not present

## 2017-09-16 DIAGNOSIS — M25561 Pain in right knee: Secondary | ICD-10-CM | POA: Diagnosis not present

## 2017-09-22 DIAGNOSIS — M1711 Unilateral primary osteoarthritis, right knee: Secondary | ICD-10-CM | POA: Diagnosis not present

## 2017-09-22 DIAGNOSIS — M25561 Pain in right knee: Secondary | ICD-10-CM | POA: Diagnosis not present

## 2017-09-22 DIAGNOSIS — R262 Difficulty in walking, not elsewhere classified: Secondary | ICD-10-CM | POA: Diagnosis not present

## 2017-09-22 DIAGNOSIS — Z471 Aftercare following joint replacement surgery: Secondary | ICD-10-CM | POA: Diagnosis not present

## 2017-10-05 DIAGNOSIS — Z471 Aftercare following joint replacement surgery: Secondary | ICD-10-CM | POA: Diagnosis not present

## 2017-10-05 DIAGNOSIS — M25561 Pain in right knee: Secondary | ICD-10-CM | POA: Diagnosis not present

## 2017-10-05 DIAGNOSIS — M1711 Unilateral primary osteoarthritis, right knee: Secondary | ICD-10-CM | POA: Diagnosis not present

## 2017-10-05 DIAGNOSIS — R262 Difficulty in walking, not elsewhere classified: Secondary | ICD-10-CM | POA: Diagnosis not present

## 2017-10-09 DIAGNOSIS — R262 Difficulty in walking, not elsewhere classified: Secondary | ICD-10-CM | POA: Diagnosis not present

## 2017-10-09 DIAGNOSIS — M1711 Unilateral primary osteoarthritis, right knee: Secondary | ICD-10-CM | POA: Diagnosis not present

## 2017-10-09 DIAGNOSIS — Z471 Aftercare following joint replacement surgery: Secondary | ICD-10-CM | POA: Diagnosis not present

## 2017-10-09 DIAGNOSIS — M25561 Pain in right knee: Secondary | ICD-10-CM | POA: Diagnosis not present

## 2017-10-12 DIAGNOSIS — M1711 Unilateral primary osteoarthritis, right knee: Secondary | ICD-10-CM | POA: Diagnosis not present

## 2017-11-12 DIAGNOSIS — E11618 Type 2 diabetes mellitus with other diabetic arthropathy: Secondary | ICD-10-CM | POA: Diagnosis not present

## 2017-11-12 DIAGNOSIS — I10 Essential (primary) hypertension: Secondary | ICD-10-CM | POA: Diagnosis not present

## 2017-11-12 DIAGNOSIS — L0390 Cellulitis, unspecified: Secondary | ICD-10-CM | POA: Diagnosis not present

## 2017-11-19 DIAGNOSIS — M1711 Unilateral primary osteoarthritis, right knee: Secondary | ICD-10-CM | POA: Diagnosis not present

## 2018-01-13 DIAGNOSIS — H401132 Primary open-angle glaucoma, bilateral, moderate stage: Secondary | ICD-10-CM | POA: Diagnosis not present

## 2018-01-28 DIAGNOSIS — H401132 Primary open-angle glaucoma, bilateral, moderate stage: Secondary | ICD-10-CM | POA: Diagnosis not present

## 2018-02-22 DIAGNOSIS — R42 Dizziness and giddiness: Secondary | ICD-10-CM | POA: Diagnosis not present

## 2018-02-22 DIAGNOSIS — R55 Syncope and collapse: Secondary | ICD-10-CM | POA: Diagnosis not present

## 2018-02-22 DIAGNOSIS — E0841 Diabetes mellitus due to underlying condition with diabetic mononeuropathy: Secondary | ICD-10-CM | POA: Diagnosis not present

## 2018-02-22 DIAGNOSIS — E782 Mixed hyperlipidemia: Secondary | ICD-10-CM | POA: Diagnosis not present

## 2018-02-22 DIAGNOSIS — I10 Essential (primary) hypertension: Secondary | ICD-10-CM | POA: Diagnosis not present

## 2018-03-15 DIAGNOSIS — R21 Rash and other nonspecific skin eruption: Secondary | ICD-10-CM | POA: Diagnosis not present

## 2018-03-15 DIAGNOSIS — E11618 Type 2 diabetes mellitus with other diabetic arthropathy: Secondary | ICD-10-CM | POA: Diagnosis not present

## 2018-03-15 DIAGNOSIS — M150 Primary generalized (osteo)arthritis: Secondary | ICD-10-CM | POA: Diagnosis not present

## 2018-03-15 DIAGNOSIS — I10 Essential (primary) hypertension: Secondary | ICD-10-CM | POA: Diagnosis not present

## 2018-07-15 DIAGNOSIS — N3281 Overactive bladder: Secondary | ICD-10-CM | POA: Diagnosis not present

## 2018-07-15 DIAGNOSIS — E1169 Type 2 diabetes mellitus with other specified complication: Secondary | ICD-10-CM | POA: Diagnosis not present

## 2018-07-15 DIAGNOSIS — I10 Essential (primary) hypertension: Secondary | ICD-10-CM | POA: Diagnosis not present

## 2018-07-28 DIAGNOSIS — N390 Urinary tract infection, site not specified: Secondary | ICD-10-CM | POA: Diagnosis not present

## 2018-07-28 DIAGNOSIS — I10 Essential (primary) hypertension: Secondary | ICD-10-CM | POA: Diagnosis not present

## 2018-11-01 ENCOUNTER — Ambulatory Visit
Admission: RE | Admit: 2018-11-01 | Discharge: 2018-11-01 | Disposition: A | Discharge status: Home / Self Care | Payer: Medicare Other | Source: Ambulatory Visit | Attending: Family Medicine | Admitting: Family Medicine

## 2018-11-01 ENCOUNTER — Other Ambulatory Visit: Payer: Self-pay | Admitting: Family Medicine

## 2018-11-01 DIAGNOSIS — I10 Essential (primary) hypertension: Secondary | ICD-10-CM | POA: Diagnosis not present

## 2018-11-01 DIAGNOSIS — E1169 Type 2 diabetes mellitus with other specified complication: Secondary | ICD-10-CM | POA: Diagnosis not present

## 2018-11-01 DIAGNOSIS — E782 Mixed hyperlipidemia: Secondary | ICD-10-CM | POA: Diagnosis not present

## 2018-11-01 DIAGNOSIS — E6609 Other obesity due to excess calories: Secondary | ICD-10-CM

## 2018-11-01 DIAGNOSIS — J9811 Atelectasis: Secondary | ICD-10-CM | POA: Diagnosis not present

## 2018-11-08 DIAGNOSIS — E1169 Type 2 diabetes mellitus with other specified complication: Secondary | ICD-10-CM | POA: Diagnosis not present

## 2018-11-08 DIAGNOSIS — I10 Essential (primary) hypertension: Secondary | ICD-10-CM | POA: Diagnosis not present

## 2019-01-19 DIAGNOSIS — H401132 Primary open-angle glaucoma, bilateral, moderate stage: Secondary | ICD-10-CM | POA: Diagnosis not present

## 2019-02-08 DIAGNOSIS — I10 Essential (primary) hypertension: Secondary | ICD-10-CM | POA: Diagnosis not present

## 2019-02-08 DIAGNOSIS — E782 Mixed hyperlipidemia: Secondary | ICD-10-CM | POA: Diagnosis not present

## 2019-02-08 DIAGNOSIS — N390 Urinary tract infection, site not specified: Secondary | ICD-10-CM | POA: Diagnosis not present

## 2019-02-08 DIAGNOSIS — E1169 Type 2 diabetes mellitus with other specified complication: Secondary | ICD-10-CM | POA: Diagnosis not present

## 2019-02-25 DIAGNOSIS — H401132 Primary open-angle glaucoma, bilateral, moderate stage: Secondary | ICD-10-CM | POA: Diagnosis not present

## 2019-03-16 DIAGNOSIS — Z1231 Encounter for screening mammogram for malignant neoplasm of breast: Secondary | ICD-10-CM | POA: Diagnosis not present

## 2019-03-23 DIAGNOSIS — H401132 Primary open-angle glaucoma, bilateral, moderate stage: Secondary | ICD-10-CM | POA: Diagnosis not present

## 2019-04-17 DIAGNOSIS — E7849 Other hyperlipidemia: Secondary | ICD-10-CM | POA: Diagnosis not present

## 2019-04-17 DIAGNOSIS — E1169 Type 2 diabetes mellitus with other specified complication: Secondary | ICD-10-CM | POA: Diagnosis not present

## 2019-04-17 DIAGNOSIS — I10 Essential (primary) hypertension: Secondary | ICD-10-CM | POA: Diagnosis not present

## 2019-06-15 DIAGNOSIS — E7849 Other hyperlipidemia: Secondary | ICD-10-CM | POA: Diagnosis not present

## 2019-06-15 DIAGNOSIS — I10 Essential (primary) hypertension: Secondary | ICD-10-CM | POA: Diagnosis not present

## 2019-06-15 DIAGNOSIS — E1169 Type 2 diabetes mellitus with other specified complication: Secondary | ICD-10-CM | POA: Diagnosis not present

## 2019-07-11 DIAGNOSIS — I10 Essential (primary) hypertension: Secondary | ICD-10-CM | POA: Diagnosis not present

## 2019-07-11 DIAGNOSIS — E1169 Type 2 diabetes mellitus with other specified complication: Secondary | ICD-10-CM | POA: Diagnosis not present

## 2019-07-11 DIAGNOSIS — N390 Urinary tract infection, site not specified: Secondary | ICD-10-CM | POA: Diagnosis not present

## 2019-07-25 DIAGNOSIS — N390 Urinary tract infection, site not specified: Secondary | ICD-10-CM | POA: Diagnosis not present

## 2019-07-25 DIAGNOSIS — E785 Hyperlipidemia, unspecified: Secondary | ICD-10-CM | POA: Diagnosis not present

## 2019-07-25 DIAGNOSIS — I10 Essential (primary) hypertension: Secondary | ICD-10-CM | POA: Diagnosis not present

## 2019-07-25 DIAGNOSIS — E1169 Type 2 diabetes mellitus with other specified complication: Secondary | ICD-10-CM | POA: Diagnosis not present

## 2019-10-25 DIAGNOSIS — E1169 Type 2 diabetes mellitus with other specified complication: Secondary | ICD-10-CM | POA: Diagnosis not present

## 2019-10-25 DIAGNOSIS — Z0000 Encounter for general adult medical examination without abnormal findings: Secondary | ICD-10-CM | POA: Diagnosis not present

## 2019-10-25 DIAGNOSIS — E782 Mixed hyperlipidemia: Secondary | ICD-10-CM | POA: Diagnosis not present

## 2019-10-25 DIAGNOSIS — I10 Essential (primary) hypertension: Secondary | ICD-10-CM | POA: Diagnosis not present

## 2019-10-25 DIAGNOSIS — K2100 Gastro-esophageal reflux disease with esophagitis, without bleeding: Secondary | ICD-10-CM | POA: Diagnosis not present

## 2019-11-28 DIAGNOSIS — Z720 Tobacco use: Secondary | ICD-10-CM | POA: Diagnosis not present

## 2019-11-28 DIAGNOSIS — I10 Essential (primary) hypertension: Secondary | ICD-10-CM | POA: Diagnosis not present

## 2019-11-28 DIAGNOSIS — E1169 Type 2 diabetes mellitus with other specified complication: Secondary | ICD-10-CM | POA: Diagnosis not present

## 2019-11-28 DIAGNOSIS — E7849 Other hyperlipidemia: Secondary | ICD-10-CM | POA: Diagnosis not present

## 2020-01-14 DIAGNOSIS — E7849 Other hyperlipidemia: Secondary | ICD-10-CM | POA: Diagnosis not present

## 2020-01-14 DIAGNOSIS — I10 Essential (primary) hypertension: Secondary | ICD-10-CM | POA: Diagnosis not present

## 2020-01-14 DIAGNOSIS — E1169 Type 2 diabetes mellitus with other specified complication: Secondary | ICD-10-CM | POA: Diagnosis not present

## 2020-01-31 IMAGING — CR DG CHEST 2V
2 series · 2 of 2 positions shown · non-contrast
Comparison: 06/24/2012

CLINICAL DATA: Preop for knee replacement

EXAM:
CHEST - 2 VIEW

[w chest pa]
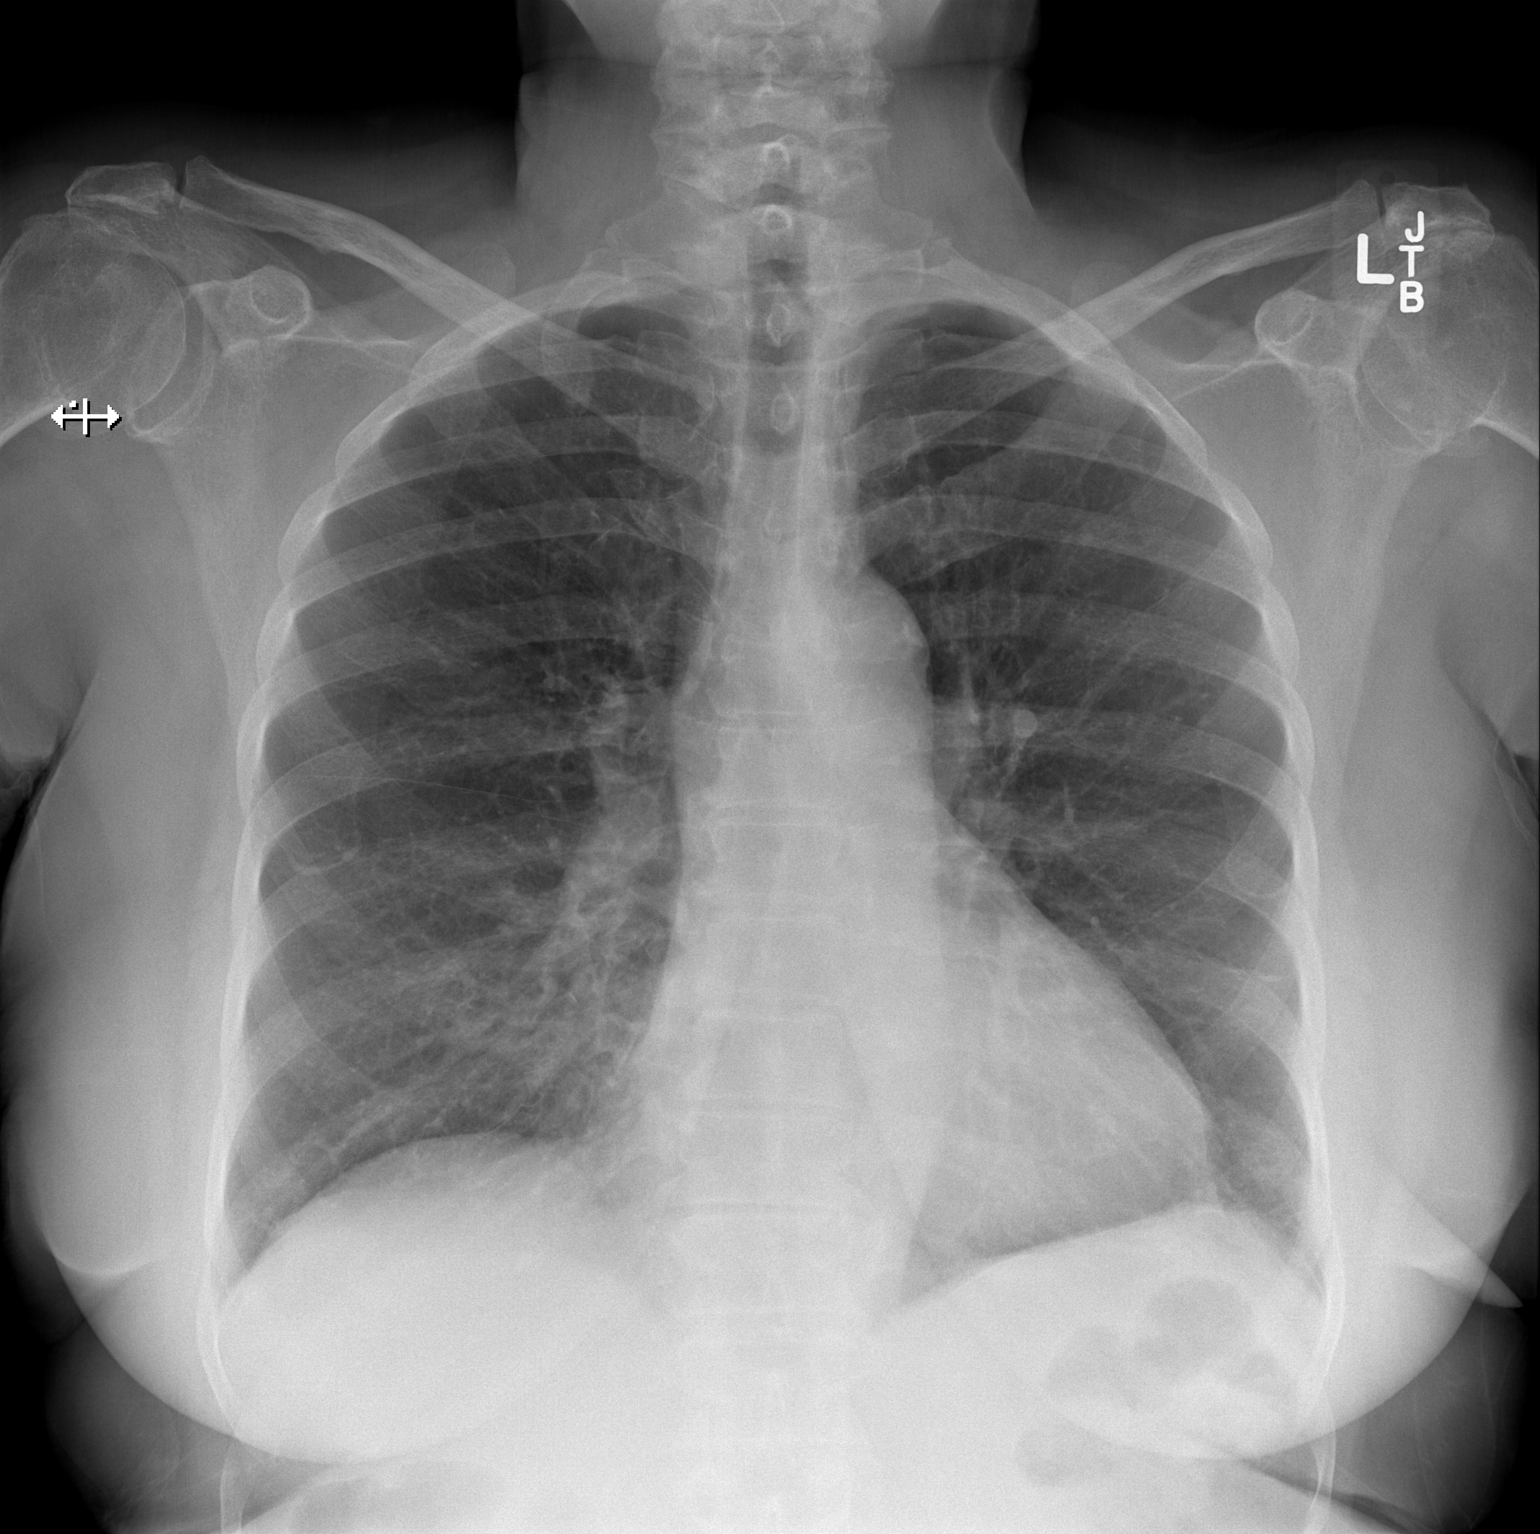

[w chest lat]
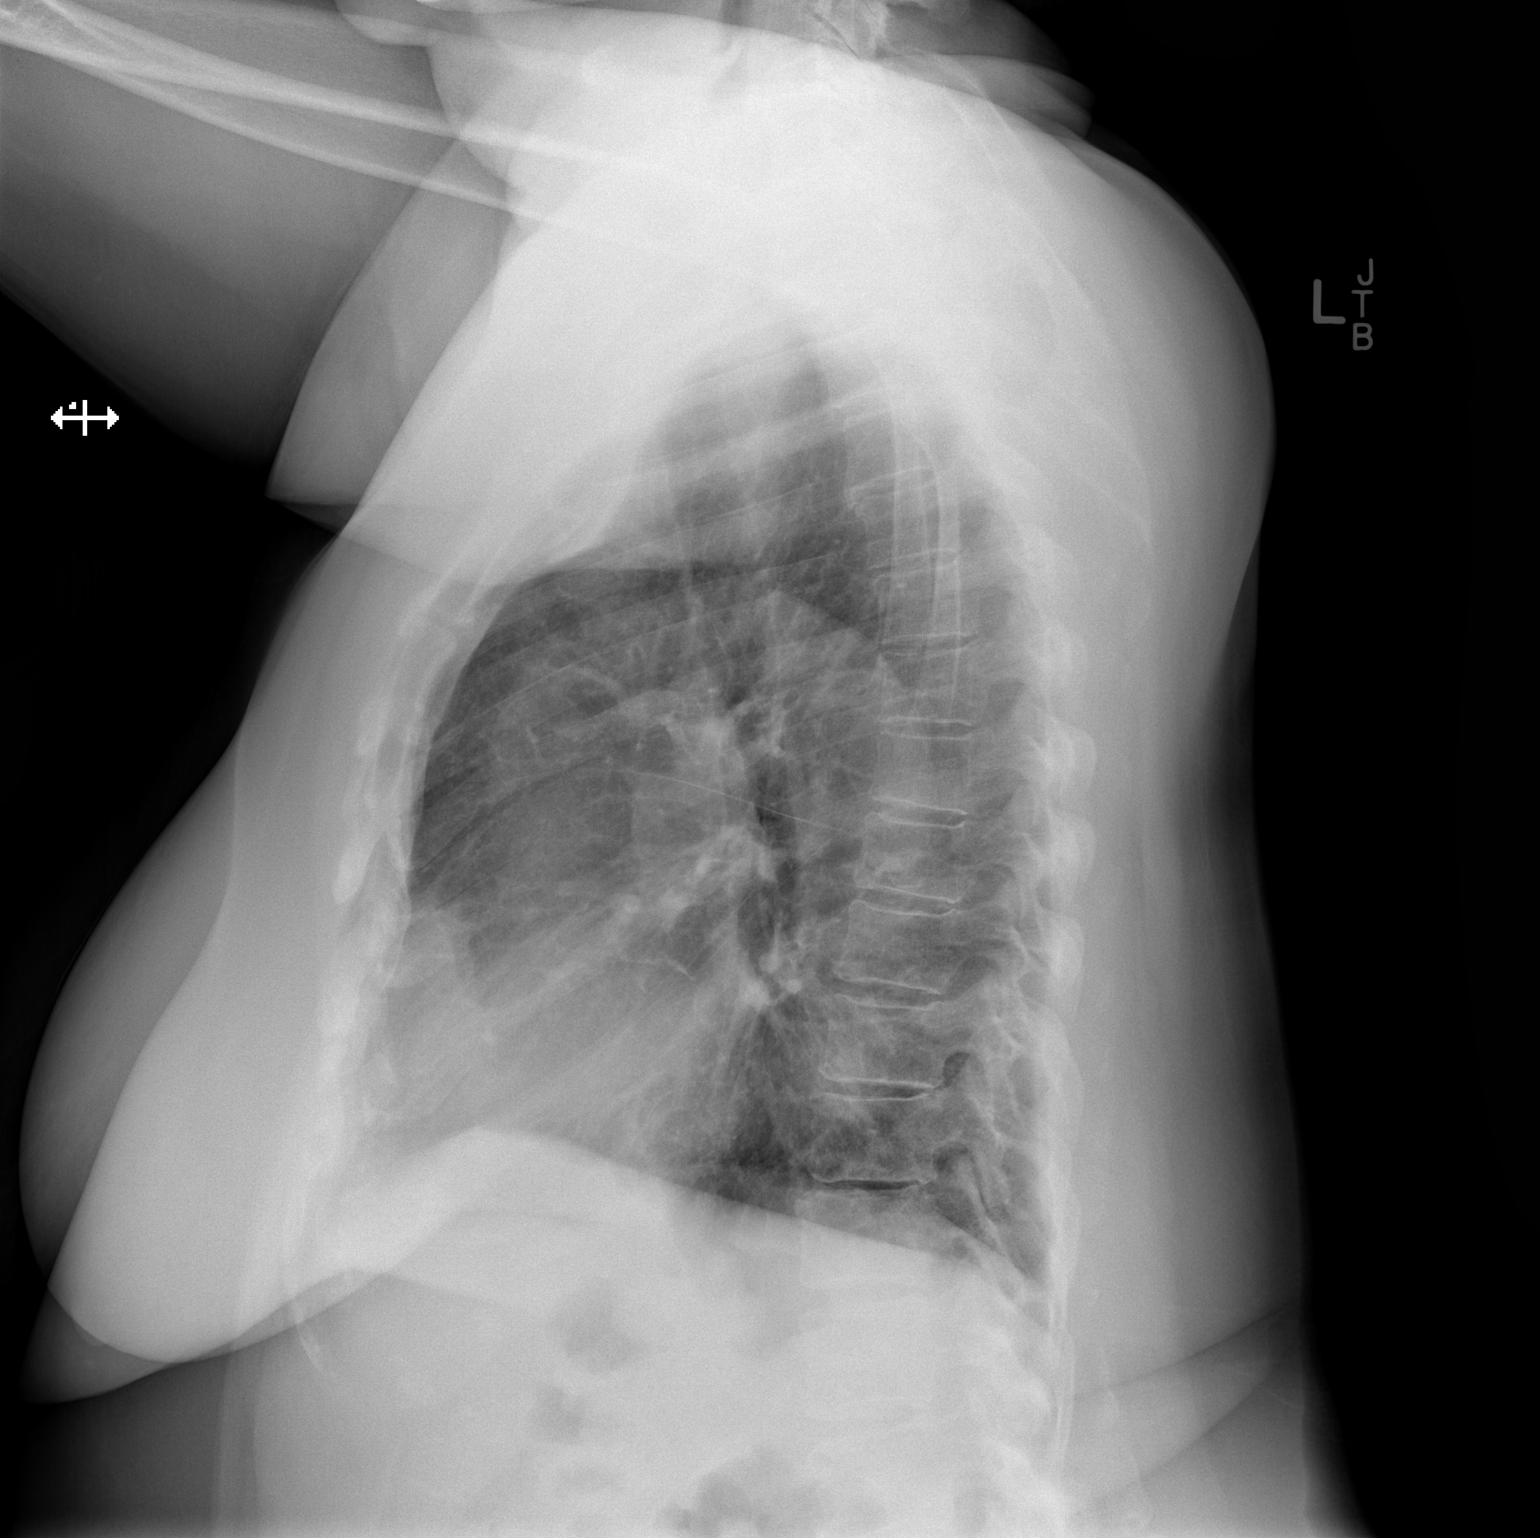

[2 of 2 positions shown; findings below may reference images not displayed]

FINDINGS: Normal heart size and mediastinal contours. No acute infiltrate or
edema. No effusion or pneumothorax. No acute osseous findings.
IMPRESSION: No evidence of active disease.  Stable from 9063.

## 2020-02-27 DIAGNOSIS — E1169 Type 2 diabetes mellitus with other specified complication: Secondary | ICD-10-CM | POA: Diagnosis not present

## 2020-02-27 DIAGNOSIS — E782 Mixed hyperlipidemia: Secondary | ICD-10-CM | POA: Diagnosis not present

## 2020-02-27 DIAGNOSIS — Z23 Encounter for immunization: Secondary | ICD-10-CM | POA: Diagnosis not present

## 2020-02-27 DIAGNOSIS — I10 Essential (primary) hypertension: Secondary | ICD-10-CM | POA: Diagnosis not present

## 2020-02-27 DIAGNOSIS — E7849 Other hyperlipidemia: Secondary | ICD-10-CM | POA: Diagnosis not present

## 2020-05-17 DIAGNOSIS — Z1231 Encounter for screening mammogram for malignant neoplasm of breast: Secondary | ICD-10-CM | POA: Diagnosis not present

## 2020-06-27 DIAGNOSIS — E1169 Type 2 diabetes mellitus with other specified complication: Secondary | ICD-10-CM | POA: Diagnosis not present

## 2020-06-27 DIAGNOSIS — Z0000 Encounter for general adult medical examination without abnormal findings: Secondary | ICD-10-CM | POA: Diagnosis not present

## 2020-06-27 DIAGNOSIS — I10 Essential (primary) hypertension: Secondary | ICD-10-CM | POA: Diagnosis not present

## 2020-06-27 DIAGNOSIS — E782 Mixed hyperlipidemia: Secondary | ICD-10-CM | POA: Diagnosis not present

## 2020-06-27 DIAGNOSIS — M150 Primary generalized (osteo)arthritis: Secondary | ICD-10-CM | POA: Diagnosis not present

## 2020-06-27 DIAGNOSIS — E785 Hyperlipidemia, unspecified: Secondary | ICD-10-CM | POA: Diagnosis not present

## 2020-07-04 ENCOUNTER — Ambulatory Visit: Payer: Medicare Other | Admitting: Podiatrist

## 2020-07-04 ENCOUNTER — Encounter: Payer: Self-pay | Admitting: Podiatrist

## 2020-07-04 ENCOUNTER — Other Ambulatory Visit: Payer: Self-pay

## 2020-07-04 DIAGNOSIS — E119 Type 2 diabetes mellitus without complications: Secondary | ICD-10-CM | POA: Diagnosis not present

## 2020-07-04 DIAGNOSIS — B351 Tinea unguium: Secondary | ICD-10-CM | POA: Diagnosis not present

## 2020-07-04 DIAGNOSIS — M79674 Pain in right toe(s): Secondary | ICD-10-CM | POA: Diagnosis not present

## 2020-07-04 DIAGNOSIS — M79675 Pain in left toe(s): Secondary | ICD-10-CM

## 2020-07-04 NOTE — Patient Instructions (Addendum)
Try CAPSACIAN cream-- available over the counter at drug stores.  If no improvement, please call and we will get you the prescription strength medication.   Neuropathic Pain Neuropathic pain is pain caused by damage to the nerves that are responsible for certain sensations in your body (sensory nerves). The pain can be caused by:  Damage to the sensory nerves that send signals to your spinal cord and brain (peripheral nervous system).  Damage to the sensory nerves in your brain or spinal cord (central nervous system). Neuropathic pain can make you more sensitive to pain. Even a minor sensation can feel very painful. This is usually a long-term condition that can be difficult to treat. The type of pain differs from person to person. It may:  Start suddenly (acute), or it may develop slowly and last for a long time (chronic).  Come and go as damaged nerves heal, or it may stay at the same level for years.  Cause emotional distress, loss of sleep, and a lower quality of life. What are the causes? The most common cause of this condition is diabetes. Many other diseases and conditions can also cause neuropathic pain. Causes of neuropathic pain can be classified as:  Toxic. This is caused by medicines and chemicals. The most common cause of toxic neuropathic pain is damage from cancer treatments (chemotherapy).  Metabolic. This can be caused by: ? Diabetes. This is the most common disease that damages the nerves. ? Lack of vitamin B from long-term alcohol abuse.  Traumatic. Any injury that cuts, crushes, or stretches a nerve can cause damage and pain. A common example is feeling pain after losing an arm or leg (phantom limb pain).  Compression-related. If a sensory nerve gets trapped or compressed for a long period of time, the blood supply to the nerve can be cut off.  Vascular. Many blood vessel diseases can cause neuropathic pain by decreasing blood supply and oxygen to  nerves.  Autoimmune. This type of pain results from diseases in which the body's defense system (immune system) mistakenly attacks sensory nerves. Examples of autoimmune diseases that can cause neuropathic pain include lupus and multiple sclerosis.  Infectious. Many types of viral infections can damage sensory nerves and cause pain. Shingles infection is a common cause of this type of pain.  Inherited. Neuropathic pain can be a symptom of many diseases that are passed down through families (genetic). What increases the risk? You are more likely to develop this condition if:  You have diabetes.  You smoke.  You drink too much alcohol.  You are taking certain medicines, including medicines that kill cancer cells (chemotherapy) or that treat immune system disorders. What are the signs or symptoms? The main symptom is pain. Neuropathic pain is often described as:  Burning.  Shock-like.  Stinging.  Hot or cold.  Itching. How is this diagnosed? No single test can diagnose neuropathic pain. It is diagnosed based on:  Physical exam and your symptoms. Your health care provider will ask you about your pain. You may be asked to use a pain scale to describe how bad your pain is.  Tests. These may be done to see if you have a high sensitivity to pain and to help find the cause and location of any sensory nerve damage. They include: ? Nerve conduction studies to test how well nerve signals travel through your sensory nerves (electrodiagnostic testing). ? Stimulating your sensory nerves through electrodes on your skin and measuring the response in your spinal cord  and brain (somatosensory evoked potential).  Imaging studies, such as: ? X-rays. ? CT scan. ? MRI. How is this treated? Treatment for neuropathic pain may change over time. You may need to try different treatment options or a combination of treatments. Some options include:  Treating the underlying cause of the neuropathy,  such as diabetes, kidney disease, or vitamin deficiencies.  Stopping medicines that can cause neuropathy, such as chemotherapy.  Medicine to relieve pain. Medicines may include: ? Prescription or over-the-counter pain medicine. ? Anti-seizure medicine. ? Antidepressant medicines. ? Pain-relieving patches that are applied to painful areas of skin. ? A medicine to numb the area (local anesthetic), which can be injected as a nerve block.  Transcutaneous nerve stimulation. This uses electrical currents to block painful nerve signals. The treatment is painless.  Alternative treatments, such as: ? Acupuncture. ? Meditation. ? Massage. ? Physical therapy. ? Pain management programs. ? Counseling. Follow these instructions at home: Medicines  Take over-the-counter and prescription medicines only as told by your health care provider.  Do not drive or use heavy machinery while taking prescription pain medicine.  If you are taking prescription pain medicine, take actions to prevent or treat constipation. Your health care provider may recommend that you: ? Drink enough fluid to keep your urine pale yellow. ? Eat foods that are high in fiber, such as fresh fruits and vegetables, whole grains, and beans. ? Limit foods that are high in fat and processed sugars, such as fried or sweet foods. ? Take an over-the-counter or prescription medicine for constipation.   Lifestyle  Have a good support system at home.  Consider joining a chronic pain support group.  Do not use any products that contain nicotine or tobacco, such as cigarettes and e-cigarettes. If you need help quitting, ask your health care provider.  Do not drink alcohol.   General instructions  Learn as much as you can about your condition.  Work closely with all your health care providers to find the treatment plan that works best for you.  Ask your health care provider what activities are safe for you.  Keep all follow-up  visits as told by your health care provider. This is important. Contact a health care provider if:  Your pain treatments are not working.  You are having side effects from your medicines.  You are struggling with tiredness (fatigue), mood changes, depression, or anxiety. Summary  Neuropathic pain is pain caused by damage to the nerves that are responsible for certain sensations in your body (sensory nerves).  Neuropathic pain may come and go as damaged nerves heal, or it may stay at the same level for years.  Neuropathic pain is usually a long-term condition that can be difficult to treat. Consider joining a chronic pain support group. This information is not intended to replace advice given to you by your health care provider. Make sure you discuss any questions you have with your health care provider. Document Revised: 06/24/2018 Document Reviewed: 03/20/2017 Elsevier Patient Education  2021 [employer].

## 2020-07-11 NOTE — Progress Notes (Signed)
Subjective: Kelly Golden presents today for a diabetic foot evaluation as well as for care of her toenails.  She relates she has neuropathy that has increasingly gotten worse and she is not to trim her own toenails.  Her last A1C was 6.4 and her blood sugar ranges from 98-118.  Her primary care doctor is Dr. Parke Golden.    Past Medical History:  Diagnosis Date  . Arthritis   . Cataracts, both eyes    RIGHT GREATER THAN LEFT  . Diabetes mellitus without complication (HCC)    on no meds, diet control  . Hypertension   . Incontinence   . Seasonal allergies   . Seizures (HCC)    as a young child      Current Outpatient Medications on File Prior to Visit  Medication Sig Dispense Refill  . betamethasone acetate-betamethasone sodium phosphate (CELESTONE) 6 (3-3) MG/ML injection Inject into the articular space.    . chlorthalidone (HYGROTON) 25 MG tablet     . diltiazem (TIAZAC) 240 MG 24 hr capsule     . naproxen (NAPROSYN) 500 MG tablet     . acetaminophen (TYLENOL) 500 MG tablet Take 1,000 mg by mouth daily as needed for moderate pain.    Marland Kitchen aspirin EC 325 MG tablet Take 1 tablet (325 mg total) by mouth daily. For 30 days 30 tablet 0  . brimonidine-timolol (COMBIGAN) 0.2-0.5 % ophthalmic solution Place 1 drop into both eyes 2 (two) times daily.    Marland Kitchen CARTIA XT 240 MG 24 hr capsule Take 240 mg by mouth daily.  0  . fexofenadine (ALLEGRA) 180 MG tablet Take 180 mg by mouth daily as needed for allergies or rhinitis.    . hydrOXYzine (VISTARIL) 25 MG capsule Take 25 mg by mouth at bedtime as needed.    . mirabegron ER (MYRBETRIQ) 25 MG TB24 tablet Take 25 mg by mouth daily.    Marland Kitchen oxyCODONE (OXY IR/ROXICODONE) 5 MG immediate release tablet 1 tab po q4-6hrs prn pain, may have to take 1-2 tabs for the first few weeks 60 tablet 0  . spironolactone (ALDACTONE) 25 MG tablet Take 25 mg by mouth 2 (two) times daily.     No current facility-administered medications on file prior to visit.     No  Known Allergies    Objective: Kelly Golden is a pleasant 80 y.o. female in NAD. AAO x 3.  There were no vitals filed for this visit.  Vascular Examination:  Capillary refill time to digits immediate b/l. Palpable pedal pulses b/l LE. Pedal hair present. Lower extremity skin temperature gradient within normal limits.  Dermatological Examination: Pedal skin with normal turgor, texture and tone bilaterally. No open wounds bilaterally. No interdigital macerations bilaterally. Toenails 1-5 b/l elongated, discolored, dystrophic, thickened, crumbly with subungual debris and tenderness to dorsal palpation. No hyperkeratotic nor porokeratotic lesions present on today's visit.  Musculoskeletal: Normal muscle strength 5/5 to all lower extremity muscle groups bilaterally. No pain crepitus or joint limitation noted with ROM b/l. No gross bony deformities bilaterally.  Neurological: Pt has subjective symptoms of neuropathy. Protective sensation intact 5/5 intact bilaterally with 10g monofilament b/l. Vibratory sensation decreased b/l. Proprioception intact bilaterally.   Assessment:   ICD-10-CM   1. Encounter for diabetic foot exam (HCC)  E11.9   2. Pain due to onychomycosis of toenails of both feet  B35.1    M79.675    M79.674      Plan: Patient examined.  Debridement of toenails was recommended.  Onychoreduction of symptomatic toenails was performed via nail nipper and power burr without iatrogenic incident.  Patient was instructed on signs and symptoms of infection and was told to call immediately should any of these arise.   I dispensed literature on diabetes and foot care Recommended capsacian cream to use to help with the neuropathy symptoms she is experiencing. She will return in 3 months for at risk diabetic foot care. She will call sooner if any problems arise.    Kelly Golden, DPM

## 2020-07-14 DIAGNOSIS — E7849 Other hyperlipidemia: Secondary | ICD-10-CM | POA: Diagnosis not present

## 2020-07-14 DIAGNOSIS — E1169 Type 2 diabetes mellitus with other specified complication: Secondary | ICD-10-CM | POA: Diagnosis not present

## 2020-07-14 DIAGNOSIS — I10 Essential (primary) hypertension: Secondary | ICD-10-CM | POA: Diagnosis not present

## 2020-09-10 DIAGNOSIS — E1169 Type 2 diabetes mellitus with other specified complication: Secondary | ICD-10-CM | POA: Diagnosis not present

## 2020-09-13 DIAGNOSIS — E7849 Other hyperlipidemia: Secondary | ICD-10-CM | POA: Diagnosis not present

## 2020-09-13 DIAGNOSIS — I10 Essential (primary) hypertension: Secondary | ICD-10-CM | POA: Diagnosis not present

## 2020-09-13 DIAGNOSIS — E1169 Type 2 diabetes mellitus with other specified complication: Secondary | ICD-10-CM | POA: Diagnosis not present

## 2020-10-04 DIAGNOSIS — N3281 Overactive bladder: Secondary | ICD-10-CM | POA: Diagnosis not present

## 2020-10-04 DIAGNOSIS — E782 Mixed hyperlipidemia: Secondary | ICD-10-CM | POA: Diagnosis not present

## 2020-10-04 DIAGNOSIS — M150 Primary generalized (osteo)arthritis: Secondary | ICD-10-CM | POA: Diagnosis not present

## 2020-10-04 DIAGNOSIS — I10 Essential (primary) hypertension: Secondary | ICD-10-CM | POA: Diagnosis not present

## 2020-10-04 DIAGNOSIS — E1169 Type 2 diabetes mellitus with other specified complication: Secondary | ICD-10-CM | POA: Diagnosis not present

## 2020-10-04 DIAGNOSIS — Z720 Tobacco use: Secondary | ICD-10-CM | POA: Diagnosis not present

## 2020-10-09 DIAGNOSIS — H401132 Primary open-angle glaucoma, bilateral, moderate stage: Secondary | ICD-10-CM | POA: Diagnosis not present

## 2020-10-09 DIAGNOSIS — E113293 Type 2 diabetes mellitus with mild nonproliferative diabetic retinopathy without macular edema, bilateral: Secondary | ICD-10-CM | POA: Diagnosis not present

## 2020-10-09 DIAGNOSIS — H04123 Dry eye syndrome of bilateral lacrimal glands: Secondary | ICD-10-CM | POA: Diagnosis not present

## 2020-11-08 DIAGNOSIS — M7601 Gluteal tendinitis, right hip: Secondary | ICD-10-CM | POA: Diagnosis not present

## 2020-11-14 DIAGNOSIS — I10 Essential (primary) hypertension: Secondary | ICD-10-CM | POA: Diagnosis not present

## 2020-11-14 DIAGNOSIS — E1169 Type 2 diabetes mellitus with other specified complication: Secondary | ICD-10-CM | POA: Diagnosis not present

## 2020-11-14 DIAGNOSIS — E7849 Other hyperlipidemia: Secondary | ICD-10-CM | POA: Diagnosis not present

## 2020-11-22 DIAGNOSIS — M545 Low back pain, unspecified: Secondary | ICD-10-CM | POA: Diagnosis not present

## 2020-11-22 DIAGNOSIS — M1711 Unilateral primary osteoarthritis, right knee: Secondary | ICD-10-CM | POA: Diagnosis not present

## 2020-12-14 DIAGNOSIS — E1169 Type 2 diabetes mellitus with other specified complication: Secondary | ICD-10-CM | POA: Diagnosis not present

## 2020-12-14 DIAGNOSIS — I10 Essential (primary) hypertension: Secondary | ICD-10-CM | POA: Diagnosis not present

## 2020-12-14 DIAGNOSIS — E7849 Other hyperlipidemia: Secondary | ICD-10-CM | POA: Diagnosis not present

## 2021-01-10 DIAGNOSIS — E113293 Type 2 diabetes mellitus with mild nonproliferative diabetic retinopathy without macular edema, bilateral: Secondary | ICD-10-CM | POA: Diagnosis not present

## 2021-01-10 DIAGNOSIS — H401132 Primary open-angle glaucoma, bilateral, moderate stage: Secondary | ICD-10-CM | POA: Diagnosis not present

## 2021-01-10 DIAGNOSIS — H04123 Dry eye syndrome of bilateral lacrimal glands: Secondary | ICD-10-CM | POA: Diagnosis not present

## 2021-01-14 DIAGNOSIS — I10 Essential (primary) hypertension: Secondary | ICD-10-CM | POA: Diagnosis not present

## 2021-01-14 DIAGNOSIS — E1169 Type 2 diabetes mellitus with other specified complication: Secondary | ICD-10-CM | POA: Diagnosis not present

## 2021-01-14 DIAGNOSIS — E7849 Other hyperlipidemia: Secondary | ICD-10-CM | POA: Diagnosis not present

## 2021-02-11 DIAGNOSIS — M5416 Radiculopathy, lumbar region: Secondary | ICD-10-CM | POA: Diagnosis not present

## 2021-02-18 DIAGNOSIS — M150 Primary generalized (osteo)arthritis: Secondary | ICD-10-CM | POA: Diagnosis not present

## 2021-02-18 DIAGNOSIS — E7800 Pure hypercholesterolemia, unspecified: Secondary | ICD-10-CM | POA: Diagnosis not present

## 2021-02-18 DIAGNOSIS — R079 Chest pain, unspecified: Secondary | ICD-10-CM | POA: Diagnosis not present

## 2021-02-18 DIAGNOSIS — I10 Essential (primary) hypertension: Secondary | ICD-10-CM | POA: Diagnosis not present

## 2021-02-18 DIAGNOSIS — K2100 Gastro-esophageal reflux disease with esophagitis, without bleeding: Secondary | ICD-10-CM | POA: Diagnosis not present

## 2021-02-18 DIAGNOSIS — E7849 Other hyperlipidemia: Secondary | ICD-10-CM | POA: Diagnosis not present

## 2021-02-18 DIAGNOSIS — E1169 Type 2 diabetes mellitus with other specified complication: Secondary | ICD-10-CM | POA: Diagnosis not present

## 2021-02-26 DIAGNOSIS — M545 Low back pain, unspecified: Secondary | ICD-10-CM | POA: Diagnosis not present

## 2021-02-28 DIAGNOSIS — M5416 Radiculopathy, lumbar region: Secondary | ICD-10-CM | POA: Diagnosis not present

## 2021-03-15 DIAGNOSIS — E1169 Type 2 diabetes mellitus with other specified complication: Secondary | ICD-10-CM | POA: Diagnosis not present

## 2021-03-15 DIAGNOSIS — I10 Essential (primary) hypertension: Secondary | ICD-10-CM | POA: Diagnosis not present

## 2021-03-15 DIAGNOSIS — E7849 Other hyperlipidemia: Secondary | ICD-10-CM | POA: Diagnosis not present

## 2021-03-26 DIAGNOSIS — M5416 Radiculopathy, lumbar region: Secondary | ICD-10-CM | POA: Diagnosis not present

## 2021-04-08 DIAGNOSIS — M5416 Radiculopathy, lumbar region: Secondary | ICD-10-CM | POA: Diagnosis not present

## 2021-04-10 DIAGNOSIS — H04123 Dry eye syndrome of bilateral lacrimal glands: Secondary | ICD-10-CM | POA: Diagnosis not present

## 2021-04-10 DIAGNOSIS — H401132 Primary open-angle glaucoma, bilateral, moderate stage: Secondary | ICD-10-CM | POA: Diagnosis not present

## 2021-04-10 DIAGNOSIS — E113293 Type 2 diabetes mellitus with mild nonproliferative diabetic retinopathy without macular edema, bilateral: Secondary | ICD-10-CM | POA: Diagnosis not present

## 2021-04-25 DIAGNOSIS — M5416 Radiculopathy, lumbar region: Secondary | ICD-10-CM | POA: Diagnosis not present

## 2021-05-06 DIAGNOSIS — R0982 Postnasal drip: Secondary | ICD-10-CM | POA: Diagnosis not present

## 2021-05-06 DIAGNOSIS — E785 Hyperlipidemia, unspecified: Secondary | ICD-10-CM | POA: Diagnosis not present

## 2021-05-06 DIAGNOSIS — E1169 Type 2 diabetes mellitus with other specified complication: Secondary | ICD-10-CM | POA: Diagnosis not present

## 2021-05-06 DIAGNOSIS — I10 Essential (primary) hypertension: Secondary | ICD-10-CM | POA: Diagnosis not present

## 2021-05-07 ENCOUNTER — Other Ambulatory Visit: Payer: Self-pay | Admitting: Family Medicine

## 2021-05-07 DIAGNOSIS — F17200 Nicotine dependence, unspecified, uncomplicated: Secondary | ICD-10-CM

## 2021-05-14 ENCOUNTER — Inpatient Hospital Stay: Admission: RE | Admit: 2021-05-14 | Payer: Medicare Other | Source: Ambulatory Visit

## 2021-05-14 DIAGNOSIS — E7849 Other hyperlipidemia: Secondary | ICD-10-CM | POA: Diagnosis not present

## 2021-05-14 DIAGNOSIS — E1169 Type 2 diabetes mellitus with other specified complication: Secondary | ICD-10-CM | POA: Diagnosis not present

## 2021-05-14 DIAGNOSIS — I10 Essential (primary) hypertension: Secondary | ICD-10-CM | POA: Diagnosis not present

## 2021-06-03 ENCOUNTER — Ambulatory Visit
Admission: RE | Admit: 2021-06-03 | Discharge: 2021-06-03 | Disposition: A | Discharge status: Home / Self Care | Payer: Medicare Other | Source: Ambulatory Visit | Attending: Family Medicine | Admitting: Family Medicine

## 2021-06-03 DIAGNOSIS — E1169 Type 2 diabetes mellitus with other specified complication: Secondary | ICD-10-CM | POA: Diagnosis not present

## 2021-06-03 DIAGNOSIS — R0982 Postnasal drip: Secondary | ICD-10-CM | POA: Diagnosis not present

## 2021-06-03 DIAGNOSIS — I10 Essential (primary) hypertension: Secondary | ICD-10-CM | POA: Diagnosis not present

## 2021-06-03 DIAGNOSIS — F17200 Nicotine dependence, unspecified, uncomplicated: Secondary | ICD-10-CM

## 2021-06-03 DIAGNOSIS — R0602 Shortness of breath: Secondary | ICD-10-CM | POA: Diagnosis not present

## 2021-06-03 DIAGNOSIS — E785 Hyperlipidemia, unspecified: Secondary | ICD-10-CM | POA: Diagnosis not present

## 2021-06-03 DIAGNOSIS — M150 Primary generalized (osteo)arthritis: Secondary | ICD-10-CM | POA: Diagnosis not present

## 2021-06-14 DIAGNOSIS — I10 Essential (primary) hypertension: Secondary | ICD-10-CM | POA: Diagnosis not present

## 2021-06-14 DIAGNOSIS — E7849 Other hyperlipidemia: Secondary | ICD-10-CM | POA: Diagnosis not present

## 2021-06-14 DIAGNOSIS — E1169 Type 2 diabetes mellitus with other specified complication: Secondary | ICD-10-CM | POA: Diagnosis not present

## 2021-07-14 DIAGNOSIS — I10 Essential (primary) hypertension: Secondary | ICD-10-CM | POA: Diagnosis not present

## 2021-07-14 DIAGNOSIS — E1169 Type 2 diabetes mellitus with other specified complication: Secondary | ICD-10-CM | POA: Diagnosis not present

## 2021-07-15 DIAGNOSIS — E1169 Type 2 diabetes mellitus with other specified complication: Secondary | ICD-10-CM | POA: Diagnosis not present

## 2021-07-15 DIAGNOSIS — N3281 Overactive bladder: Secondary | ICD-10-CM | POA: Diagnosis not present

## 2021-07-15 DIAGNOSIS — I10 Essential (primary) hypertension: Secondary | ICD-10-CM | POA: Diagnosis not present

## 2021-07-15 DIAGNOSIS — E782 Mixed hyperlipidemia: Secondary | ICD-10-CM | POA: Diagnosis not present

## 2021-09-12 DIAGNOSIS — Z1231 Encounter for screening mammogram for malignant neoplasm of breast: Secondary | ICD-10-CM | POA: Diagnosis not present

## 2021-10-14 DIAGNOSIS — E1169 Type 2 diabetes mellitus with other specified complication: Secondary | ICD-10-CM | POA: Diagnosis not present

## 2021-10-14 DIAGNOSIS — I10 Essential (primary) hypertension: Secondary | ICD-10-CM | POA: Diagnosis not present

## 2021-10-14 DIAGNOSIS — E7849 Other hyperlipidemia: Secondary | ICD-10-CM | POA: Diagnosis not present

## 2022-01-16 DIAGNOSIS — M791 Myalgia, unspecified site: Secondary | ICD-10-CM | POA: Diagnosis not present

## 2022-01-16 DIAGNOSIS — I10 Essential (primary) hypertension: Secondary | ICD-10-CM | POA: Diagnosis not present

## 2022-01-16 DIAGNOSIS — E1169 Type 2 diabetes mellitus with other specified complication: Secondary | ICD-10-CM | POA: Diagnosis not present

## 2022-01-16 DIAGNOSIS — E7849 Other hyperlipidemia: Secondary | ICD-10-CM | POA: Diagnosis not present

## 2022-01-16 DIAGNOSIS — M7918 Myalgia, other site: Secondary | ICD-10-CM | POA: Diagnosis not present

## 2022-01-16 DIAGNOSIS — E7800 Pure hypercholesterolemia, unspecified: Secondary | ICD-10-CM | POA: Diagnosis not present

## 2022-01-30 DIAGNOSIS — M7601 Gluteal tendinitis, right hip: Secondary | ICD-10-CM | POA: Diagnosis not present

## 2022-01-30 DIAGNOSIS — E1169 Type 2 diabetes mellitus with other specified complication: Secondary | ICD-10-CM | POA: Diagnosis not present

## 2022-01-30 DIAGNOSIS — H04123 Dry eye syndrome of bilateral lacrimal glands: Secondary | ICD-10-CM | POA: Diagnosis not present

## 2022-01-30 DIAGNOSIS — I10 Essential (primary) hypertension: Secondary | ICD-10-CM | POA: Diagnosis not present

## 2022-01-30 DIAGNOSIS — E113293 Type 2 diabetes mellitus with mild nonproliferative diabetic retinopathy without macular edema, bilateral: Secondary | ICD-10-CM | POA: Diagnosis not present

## 2022-01-30 DIAGNOSIS — H401132 Primary open-angle glaucoma, bilateral, moderate stage: Secondary | ICD-10-CM | POA: Diagnosis not present

## 2022-04-15 DIAGNOSIS — I10 Essential (primary) hypertension: Secondary | ICD-10-CM | POA: Diagnosis not present

## 2022-04-15 DIAGNOSIS — E7849 Other hyperlipidemia: Secondary | ICD-10-CM | POA: Diagnosis not present

## 2022-04-15 DIAGNOSIS — E1169 Type 2 diabetes mellitus with other specified complication: Secondary | ICD-10-CM | POA: Diagnosis not present

## 2022-05-30 DIAGNOSIS — M7918 Myalgia, other site: Secondary | ICD-10-CM | POA: Diagnosis not present

## 2022-05-30 DIAGNOSIS — E789 Disorder of lipoprotein metabolism, unspecified: Secondary | ICD-10-CM | POA: Diagnosis not present

## 2022-05-30 DIAGNOSIS — M150 Primary generalized (osteo)arthritis: Secondary | ICD-10-CM | POA: Diagnosis not present

## 2022-05-30 DIAGNOSIS — I10 Essential (primary) hypertension: Secondary | ICD-10-CM | POA: Diagnosis not present

## 2022-05-30 DIAGNOSIS — E7849 Other hyperlipidemia: Secondary | ICD-10-CM | POA: Diagnosis not present

## 2022-05-30 DIAGNOSIS — E1169 Type 2 diabetes mellitus with other specified complication: Secondary | ICD-10-CM | POA: Diagnosis not present

## 2022-06-05 DIAGNOSIS — H401132 Primary open-angle glaucoma, bilateral, moderate stage: Secondary | ICD-10-CM | POA: Diagnosis not present

## 2022-06-15 DIAGNOSIS — E7849 Other hyperlipidemia: Secondary | ICD-10-CM | POA: Diagnosis not present

## 2022-06-15 DIAGNOSIS — E1169 Type 2 diabetes mellitus with other specified complication: Secondary | ICD-10-CM | POA: Diagnosis not present

## 2022-06-15 DIAGNOSIS — I10 Essential (primary) hypertension: Secondary | ICD-10-CM | POA: Diagnosis not present

## 2022-06-17 DIAGNOSIS — E1169 Type 2 diabetes mellitus with other specified complication: Secondary | ICD-10-CM | POA: Diagnosis not present

## 2022-07-22 DIAGNOSIS — M25561 Pain in right knee: Secondary | ICD-10-CM | POA: Diagnosis not present

## 2022-07-22 DIAGNOSIS — M25562 Pain in left knee: Secondary | ICD-10-CM | POA: Diagnosis not present

## 2022-07-29 DIAGNOSIS — M1712 Unilateral primary osteoarthritis, left knee: Secondary | ICD-10-CM | POA: Diagnosis not present

## 2022-08-05 DIAGNOSIS — M1712 Unilateral primary osteoarthritis, left knee: Secondary | ICD-10-CM | POA: Diagnosis not present

## 2022-08-12 DIAGNOSIS — M1712 Unilateral primary osteoarthritis, left knee: Secondary | ICD-10-CM | POA: Diagnosis not present

## 2022-09-11 DIAGNOSIS — H401132 Primary open-angle glaucoma, bilateral, moderate stage: Secondary | ICD-10-CM | POA: Diagnosis not present

## 2022-09-11 DIAGNOSIS — E113293 Type 2 diabetes mellitus with mild nonproliferative diabetic retinopathy without macular edema, bilateral: Secondary | ICD-10-CM | POA: Diagnosis not present

## 2022-09-11 DIAGNOSIS — H04123 Dry eye syndrome of bilateral lacrimal glands: Secondary | ICD-10-CM | POA: Diagnosis not present

## 2022-11-13 DIAGNOSIS — E113293 Type 2 diabetes mellitus with mild nonproliferative diabetic retinopathy without macular edema, bilateral: Secondary | ICD-10-CM | POA: Diagnosis not present

## 2022-11-13 DIAGNOSIS — H401132 Primary open-angle glaucoma, bilateral, moderate stage: Secondary | ICD-10-CM | POA: Diagnosis not present

## 2022-11-13 DIAGNOSIS — H04123 Dry eye syndrome of bilateral lacrimal glands: Secondary | ICD-10-CM | POA: Diagnosis not present

## 2022-12-04 DIAGNOSIS — Z1231 Encounter for screening mammogram for malignant neoplasm of breast: Secondary | ICD-10-CM | POA: Diagnosis not present

## 2023-01-05 DIAGNOSIS — M1712 Unilateral primary osteoarthritis, left knee: Secondary | ICD-10-CM | POA: Diagnosis not present

## 2023-01-08 ENCOUNTER — Other Ambulatory Visit: Payer: Self-pay

## 2023-01-08 ENCOUNTER — Encounter: Payer: Self-pay | Admitting: *Deleted

## 2023-01-08 ENCOUNTER — Ambulatory Visit
Admission: EM | Admit: 2023-01-08 | Discharge: 2023-01-08 | Disposition: A | Discharge status: Home / Self Care | Payer: Medicare Other | Attending: Family Medicine | Admitting: Family Medicine

## 2023-01-08 DIAGNOSIS — R1011 Right upper quadrant pain: Secondary | ICD-10-CM

## 2023-01-08 LAB — POCT URINALYSIS DIP (MANUAL ENTRY)
Bilirubin, UA: NEGATIVE
Blood, UA: NEGATIVE
Glucose, UA: NEGATIVE mg/dL
Ketones, POC UA: NEGATIVE mg/dL
Nitrite, UA: NEGATIVE
Protein Ur, POC: NEGATIVE mg/dL
Spec Grav, UA: 1.025 (ref 1.010–1.025)
Urobilinogen, UA: 0.2 U/dL
pH, UA: 5.5 (ref 5.0–8.0)

## 2023-01-08 LAB — POCT FASTING CBG KUC MANUAL ENTRY: POCT Glucose (KUC): 112 mg/dL — AB (ref 70–99)

## 2023-01-08 NOTE — Discharge Instructions (Signed)
You have had labs (urine culture and blood tests) sent today. We will call you with any significant abnormalities or if there is need to begin or change treatment or pursue further follow up.  You may also review your test results online through MyChart. If you do not have a MyChart account, instructions to sign up should be on your discharge paperwork.  You have been seen today for abdominal pain. Your evaluation was not suggestive of any emergent condition requiring medical intervention at this time. However, some abdominal problems make take more time to appear. Therefore, it is very important for you to pay attention to any new symptoms or worsening of your current condition.  Please return here or to the Emergency Department immediately should you begin to feel worse in any way or have any of the following symptoms: increasing or different abdominal pain, persistent vomiting, inability to drink fluids, fevers, or shaking chills.

## 2023-01-08 NOTE — ED Provider Notes (Signed)
[facility]   [id number] 01/08/23 Arrival Time: 1010  ASSESSMENT & PLAN:  1. Right upper quadrant abdominal pain    Unclear cause of her pain. Benign abdominal/flank exam. Benign abdominal exam. No indications for urgent abdominal/pelvic imaging at this time. Discussed.  ECG interpreted by me: sinus bradycardia.  She is comfortable with home observation. Has PCP appt next week.  Pending: Labs Reviewed  URINE CULTURE  CBC WITH DIFFERENTIAL/PLATELET  COMPREHENSIVE METABOLIC PANEL  LIPASE, BLOOD      Discharge Instructions      You have had labs (urine culture and blood tests) sent today. We will call you with any significant abnormalities or if there is need to begin or change treatment or pursue further follow up.  You may also review your test results online through MyChart. If you do not have a MyChart account, instructions to sign up should be on your discharge paperwork.  You have been seen today for abdominal pain. Your evaluation was not suggestive of any emergent condition requiring medical intervention at this time. However, some abdominal problems make take more time to appear. Therefore, it is very important for you to pay attention to any new symptoms or worsening of your current condition.  Please return here or to the Emergency Department immediately should you begin to feel worse in any way or have any of the following symptoms: increasing or different abdominal pain, persistent vomiting, inability to drink fluids, fevers, or shaking chills.       Follow-up Information     Renaye Rakers, MD.   Specialty: Family Medicine Why: Keep your upcoming appointment. Contact information: [address] [place] [address] [phone]         [facility] Emergency Department at [facility]   Specialty: Emergency Medicine Why: If symptoms worsen in any way. Contact information: [address] [place]  [address] [phone]               Reviewed expectations re: course of current medical issues. Questions answered. Outlined signs and symptoms indicating need for more acute intervention. Patient verbalized understanding. After Visit Summary given.   SUBJECTIVE: History from: patient. Kelly Golden is a 82 y.o. female who presents with complaint of R abdominal/flank pain; gradual onset; first noted approx one month ago; on/off; does not wake her from sleep; initially with decreased appetite but this has returned to normal. Denies assoc n/v/d. Denies CP/SOB. Currently with minimal symptoms. Reports normal bowel/bladder habits.  No LMP recorded. Patient is postmenopausal.  Past Surgical History:  Procedure Laterality Date   CATARACT EXTRACTION W/PHACO Right 06/30/2012   Procedure: CATARACT EXTRACTION PHACO AND INTRAOCULAR LENS PLACEMENT (IOC);  Surgeon: Shade Flood, MD;  Location: [facility] OR;  Service: Ophthalmology;  Laterality: Right;   EYE SURGERY Left    cataract IOL   TOTAL KNEE ARTHROPLASTY Right 08/21/2017   Procedure: TOTAL KNEE ARTHROPLASTY;  Surgeon: Frederico Hamman, MD;  Location: [facility] OR;  Service: Orthopedics;  Laterality: Right;   TUBAL LIGATION       OBJECTIVE:  Vitals:   01/08/23 1103  BP: (!) 154/69  Pulse: (!) 53  Resp: 16  Temp: 98.4 F (36.9 C)  TempSrc: Oral  SpO2: 100%    General appearance: alert, oriented, no acute distress HEENT: [place]; AT; oropharynx moist Lungs: unlabored respirations Abdomen: soft; without distention; no specific tenderness to palpation; normal bowel sounds; without masses or organomegaly; without guarding or rebound tenderness Back: without reported CVA tenderness; FROM  at waist Extremities: without LE edema; symmetrical; without gross deformities Skin: warm and dry Neurologic: normal gait Psychological: alert and cooperative; normal mood and affect  Labs: Results for orders placed or performed during the hospital  encounter of 01/08/23  POCT urinalysis dipstick  Result Value Ref Range   Color, UA yellow yellow   Clarity, UA cloudy (A) clear   Glucose, UA negative negative mg/dL   Bilirubin, UA negative negative   Ketones, POC UA negative negative mg/dL   Spec Grav, UA 7.829 5.621 - 1.025   Blood, UA negative negative   pH, UA 5.5 5.0 - 8.0   Protein Ur, POC negative negative mg/dL   Urobilinogen, UA 0.2 0.2 or 1.0 E.U./dL   Nitrite, UA Negative Negative   Leukocytes, UA Small (1+) (A) Negative  POCT CBG (manual entry)  Result Value Ref Range   POCT Glucose ([facility]) 112 (A) 70 - 99 mg/dL   Labs Reviewed  POCT URINALYSIS DIP (MANUAL ENTRY) - Abnormal; Notable for the following components:      Result Value   Clarity, UA cloudy (*)    Leukocytes, UA Small (1+) (*)    All other components within normal limits  POCT FASTING CBG [facility] MANUAL ENTRY - Abnormal; Notable for the following components:   POCT Glucose ([facility]) 112 (*)    All other components within normal limits  URINE CULTURE  CBC WITH DIFFERENTIAL/PLATELET  COMPREHENSIVE METABOLIC PANEL  LIPASE, BLOOD    Imaging: No results found.   No Known Allergies                                             Past Medical History:  Diagnosis Date   Arthritis    Cataracts, both eyes    RIGHT GREATER THAN LEFT   Diabetes mellitus without complication (HCC)    on no meds, diet control   Hypertension    Incontinence    Seasonal allergies    Seizures (HCC)    as a young child    Social History   Socioeconomic History   Marital status: Married    Spouse name: Not on file   Number of children: Not on file   Years of education: Not on file   Highest education level: Not on file  Occupational History   Not on file  Tobacco Use   Smoking status: Former    Current packs/day: 0.00    Average packs/day: 1 pack/day for 20.0 years (20.0 ttl pk-yrs)    Types: Cigarettes    Start date: 06/24/1977    Quit date: 06/24/1997    Years since  quitting: 25.5   Smokeless tobacco: Never  Vaping Use   Vaping status: Never Used  Substance and Sexual Activity   Alcohol use: Yes    Comment: social   Drug use: No   Sexual activity: Not on file  Other Topics Concern   Not on file  Social History Narrative   Not on file   Social Determinants of Health   Financial Resource Strain: Not on file  Food Insecurity: Not on file  Transportation Needs: Not on file  Physical Activity: Not on file  Stress: Not on file  Social Connections: Not on file  Intimate Partner Violence: Not on file    History reviewed. No pertinent family history.   Mardella Layman, MD 01/08/23 1325

## 2023-01-08 NOTE — ED Triage Notes (Signed)
Pt reports right side flank pain x 1 month. States pain is "working its way up"  (points to right ribs) and is "sore". States her urine has had a "fruity smell" in the morning. Also reports feeling intermittent "fluttering" mid chest. Denies chest pain/SOB

## 2023-01-09 LAB — CBC WITH DIFFERENTIAL/PLATELET
Basophils Absolute: 0 10*3/uL (ref 0.0–0.2)
Basos: 0 %
EOS (ABSOLUTE): 0.1 10*3/uL (ref 0.0–0.4)
Eos: 1 %
Hematocrit: 45.9 % (ref 34.0–46.6)
Hemoglobin: 14.3 g/dL (ref 11.1–15.9)
Immature Grans (Abs): 0 10*3/uL (ref 0.0–0.1)
Immature Granulocytes: 0 %
Lymphocytes Absolute: 2.7 10*3/uL (ref 0.7–3.1)
Lymphs: 29 %
MCH: 25.4 pg — ABNORMAL LOW (ref 26.6–33.0)
MCHC: 31.2 g/dL — ABNORMAL LOW (ref 31.5–35.7)
MCV: 81 fL (ref 79–97)
Monocytes Absolute: 1.1 10*3/uL — ABNORMAL HIGH (ref 0.1–0.9)
Monocytes: 12 %
Neutrophils Absolute: 5.5 10*3/uL (ref 1.4–7.0)
Neutrophils: 58 %
Platelets: 326 10*3/uL (ref 150–450)
RBC: 5.64 x10E6/uL — ABNORMAL HIGH (ref 3.77–5.28)
RDW: 13.6 % (ref 11.7–15.4)
WBC: 9.4 10*3/uL (ref 3.4–10.8)

## 2023-01-09 LAB — COMPREHENSIVE METABOLIC PANEL
ALT: 23 [IU]/L (ref 0–32)
AST: 18 [IU]/L (ref 0–40)
Albumin: 4.4 g/dL (ref 3.7–4.7)
Alkaline Phosphatase: 84 [IU]/L (ref 44–121)
BUN/Creatinine Ratio: 20 (ref 12–28)
BUN: 17 mg/dL (ref 8–27)
Bilirubin Total: 0.2 mg/dL (ref 0.0–1.2)
CO2: 25 mmol/L (ref 20–29)
Calcium: 10.3 mg/dL (ref 8.7–10.3)
Chloride: 103 mmol/L (ref 96–106)
Creatinine, Ser: 0.87 mg/dL (ref 0.57–1.00)
Globulin, Total: 2.7 g/dL (ref 1.5–4.5)
Glucose: 109 mg/dL — ABNORMAL HIGH (ref 70–99)
Potassium: 4.1 mmol/L (ref 3.5–5.2)
Sodium: 140 mmol/L (ref 134–144)
Total Protein: 7.1 g/dL (ref 6.0–8.5)
eGFR: 67 mL/min/{1.73_m2} (ref >59)

## 2023-01-10 LAB — URINE CULTURE: Culture: >=100000 — AB

## 2023-01-11 ENCOUNTER — Telehealth: Payer: Self-pay

## 2023-01-11 MED ORDER — NITROFURANTOIN MONOHYD MACRO 100 MG PO CAPS: 100.0000 mg | ORAL_CAPSULE | Freq: Two times a day (BID) | ORAL | 0 refills | Status: AC

## 2023-01-11 NOTE — Telephone Encounter (Signed)
Per Helaine Chess,  PA-C, "Macrobid 100 mg twice daily x 5 days." Reviewed with patient, verified pharmacy, prescription sent.

## 2023-01-13 DIAGNOSIS — Z0000 Encounter for general adult medical examination without abnormal findings: Secondary | ICD-10-CM | POA: Diagnosis not present

## 2023-01-13 DIAGNOSIS — E559 Vitamin D deficiency, unspecified: Secondary | ICD-10-CM | POA: Diagnosis not present

## 2023-01-13 DIAGNOSIS — M940 Chondrocostal junction syndrome [Tietze]: Secondary | ICD-10-CM | POA: Diagnosis not present

## 2023-01-13 DIAGNOSIS — E1169 Type 2 diabetes mellitus with other specified complication: Secondary | ICD-10-CM | POA: Diagnosis not present

## 2023-01-13 DIAGNOSIS — I10 Essential (primary) hypertension: Secondary | ICD-10-CM | POA: Diagnosis not present

## 2023-01-13 DIAGNOSIS — E785 Hyperlipidemia, unspecified: Secondary | ICD-10-CM | POA: Diagnosis not present

## 2023-01-13 DIAGNOSIS — N390 Urinary tract infection, site not specified: Secondary | ICD-10-CM | POA: Diagnosis not present

## 2023-02-18 DIAGNOSIS — M1712 Unilateral primary osteoarthritis, left knee: Secondary | ICD-10-CM | POA: Diagnosis not present

## 2023-02-27 DIAGNOSIS — M1712 Unilateral primary osteoarthritis, left knee: Secondary | ICD-10-CM | POA: Diagnosis not present

## 2023-03-03 DIAGNOSIS — M1712 Unilateral primary osteoarthritis, left knee: Secondary | ICD-10-CM | POA: Diagnosis not present

## 2023-04-15 DIAGNOSIS — N3281 Overactive bladder: Secondary | ICD-10-CM | POA: Diagnosis not present

## 2023-04-15 DIAGNOSIS — E1169 Type 2 diabetes mellitus with other specified complication: Secondary | ICD-10-CM | POA: Diagnosis not present

## 2023-04-15 DIAGNOSIS — N390 Urinary tract infection, site not specified: Secondary | ICD-10-CM | POA: Diagnosis not present

## 2023-04-15 DIAGNOSIS — E785 Hyperlipidemia, unspecified: Secondary | ICD-10-CM | POA: Diagnosis not present

## 2023-04-15 DIAGNOSIS — I10 Essential (primary) hypertension: Secondary | ICD-10-CM | POA: Diagnosis not present

## 2023-04-29 DIAGNOSIS — E1169 Type 2 diabetes mellitus with other specified complication: Secondary | ICD-10-CM | POA: Diagnosis not present

## 2023-04-29 DIAGNOSIS — I10 Essential (primary) hypertension: Secondary | ICD-10-CM | POA: Diagnosis not present

## 2023-04-29 DIAGNOSIS — N390 Urinary tract infection, site not specified: Secondary | ICD-10-CM | POA: Diagnosis not present

## 2023-09-24 DIAGNOSIS — Z91148 Patient's other noncompliance with medication regimen for other reason: Secondary | ICD-10-CM | POA: Diagnosis not present

## 2023-09-24 DIAGNOSIS — M130 Polyarthritis, unspecified: Secondary | ICD-10-CM | POA: Diagnosis not present

## 2023-09-24 DIAGNOSIS — E1165 Type 2 diabetes mellitus with hyperglycemia: Secondary | ICD-10-CM | POA: Diagnosis not present

## 2023-09-24 DIAGNOSIS — M150 Primary generalized (osteo)arthritis: Secondary | ICD-10-CM | POA: Diagnosis not present

## 2023-09-24 DIAGNOSIS — E559 Vitamin D deficiency, unspecified: Secondary | ICD-10-CM | POA: Diagnosis not present

## 2023-09-24 DIAGNOSIS — I10 Essential (primary) hypertension: Secondary | ICD-10-CM | POA: Diagnosis not present

## 2023-09-24 DIAGNOSIS — E782 Mixed hyperlipidemia: Secondary | ICD-10-CM | POA: Diagnosis not present

## 2023-09-24 DIAGNOSIS — E1169 Type 2 diabetes mellitus with other specified complication: Secondary | ICD-10-CM | POA: Diagnosis not present

## 2023-11-03 DIAGNOSIS — H04123 Dry eye syndrome of bilateral lacrimal glands: Secondary | ICD-10-CM | POA: Diagnosis not present

## 2023-11-03 DIAGNOSIS — H401132 Primary open-angle glaucoma, bilateral, moderate stage: Secondary | ICD-10-CM | POA: Diagnosis not present

## 2023-11-03 DIAGNOSIS — E113293 Type 2 diabetes mellitus with mild nonproliferative diabetic retinopathy without macular edema, bilateral: Secondary | ICD-10-CM | POA: Diagnosis not present

## 2023-11-03 DIAGNOSIS — H524 Presbyopia: Secondary | ICD-10-CM | POA: Diagnosis not present

## 2023-11-23 IMAGING — CT CT CHEST W/O CM
2 of 5 series · 15 of 36 positions shown, 18 images · non-contrast
Comparison: None.

CLINICAL DATA: Shortness of breath



[Series 4: chest 2.00 br40 s3 · coronal · 0.56mm/px · 3 of 152 slices shown]
[im 31/152  lung]
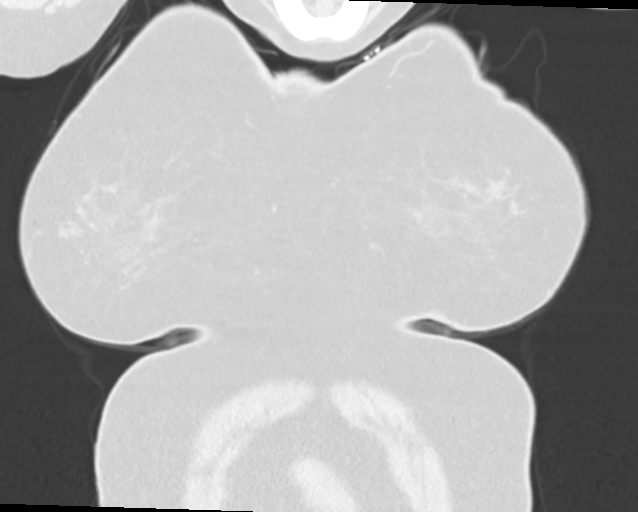
[im 61/152  lung]
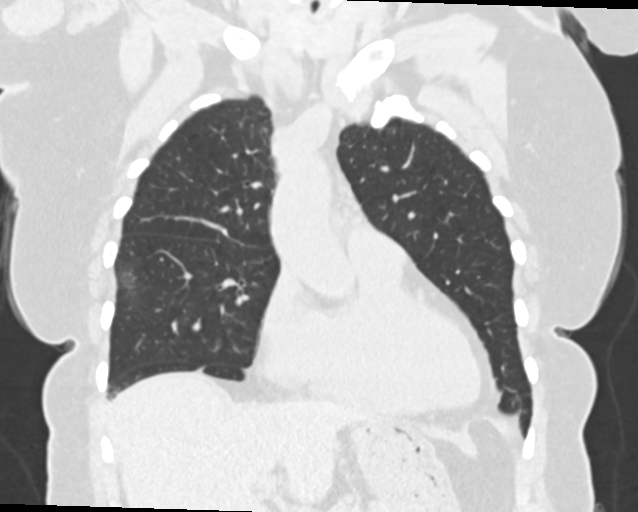
[im 91/152  lung]
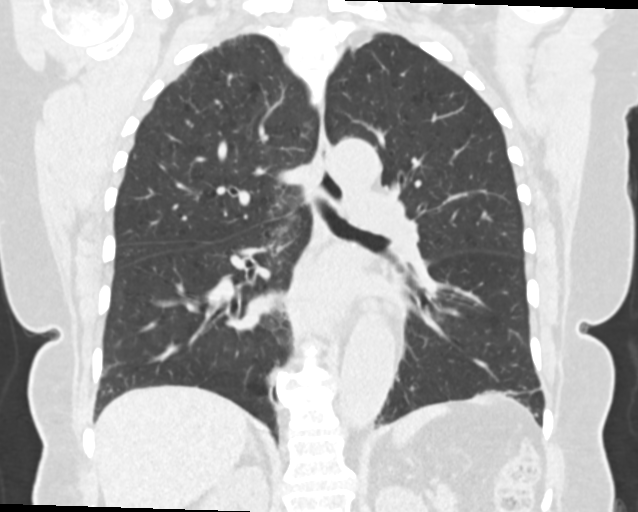

[Series 10: chest 1.00 br40 s3 super d · axial · 0.70mm/px · z∈[+1646,+1893]mm · 12 of 357 slices shown, 15 images]
[im 24/357  mediastinal]
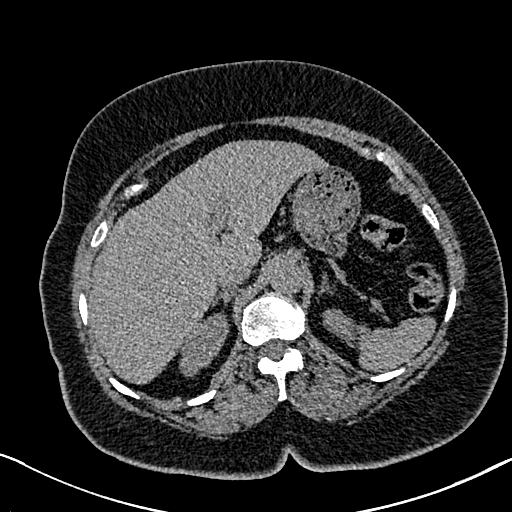
[im 24/357  lung]
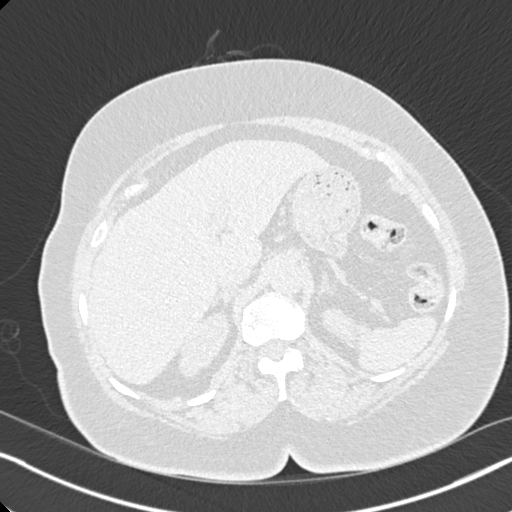
[im 48/357  lung]
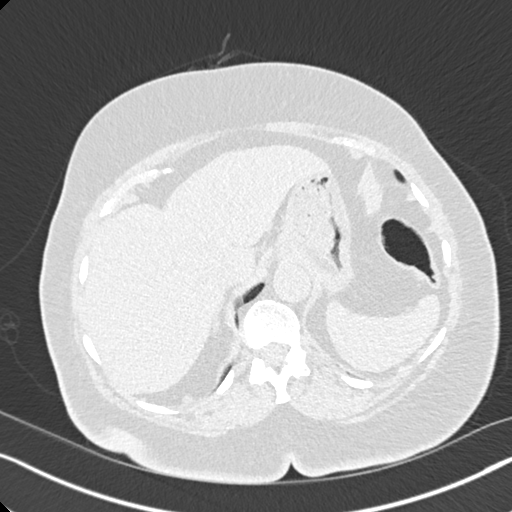
[im 72/357  lung]
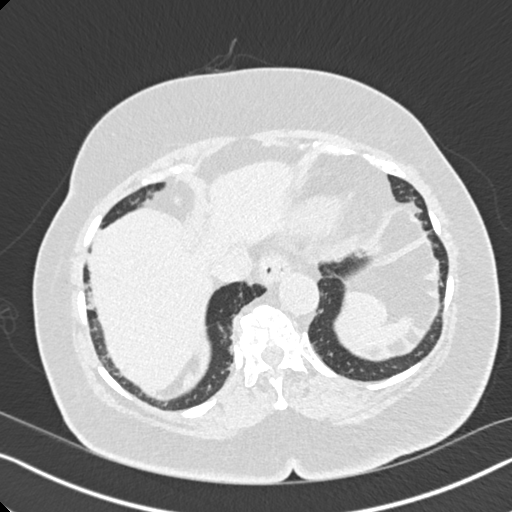
[im 119/357  lung]
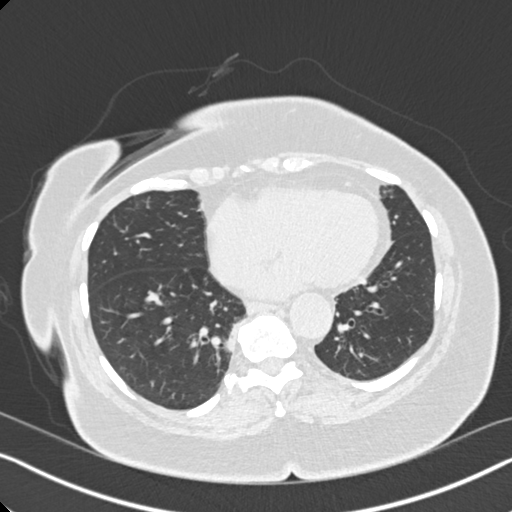
[im 143/357  mediastinal]
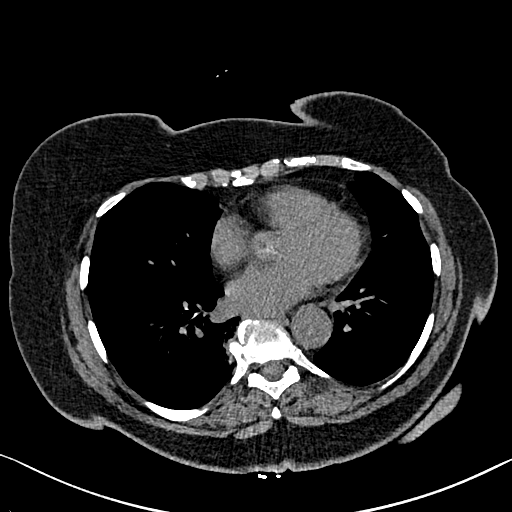
[im 143/357  lung]
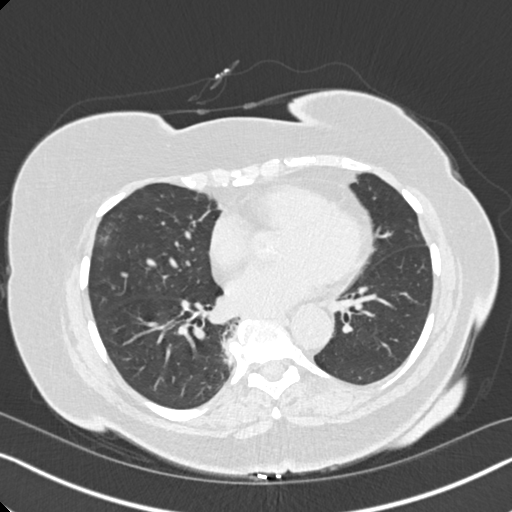
[im 167/357  lung]
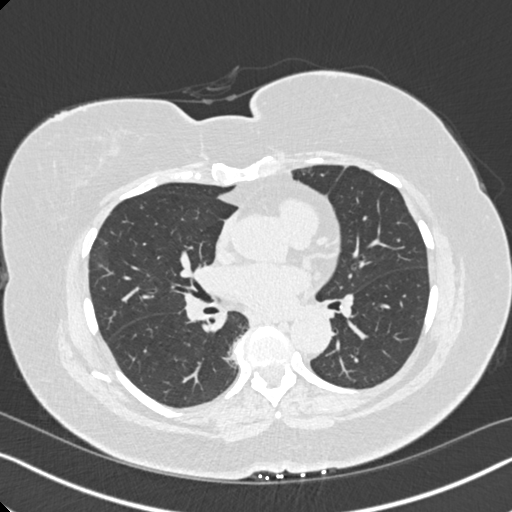
[im 190/357  lung]
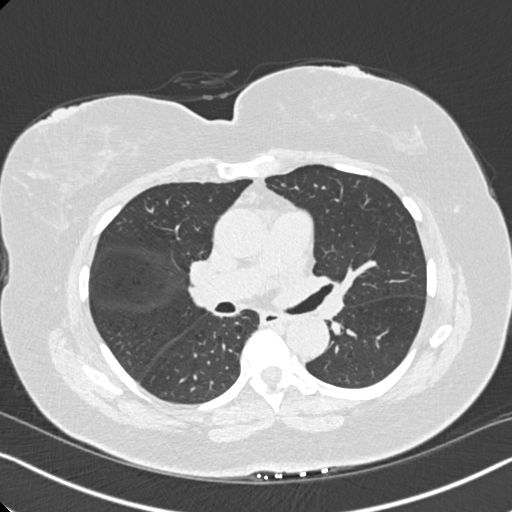
[im 214/357  lung]
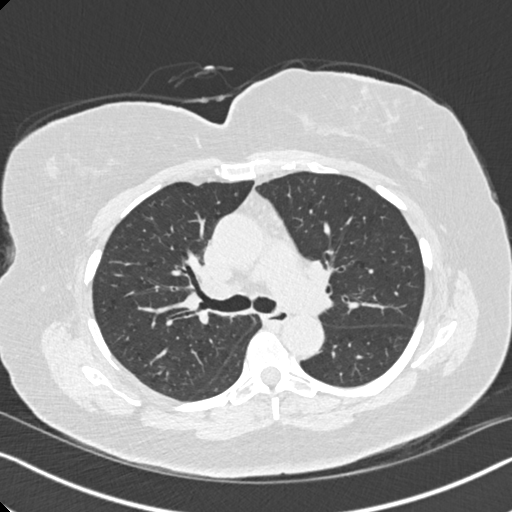
[im 238/357  mediastinal]
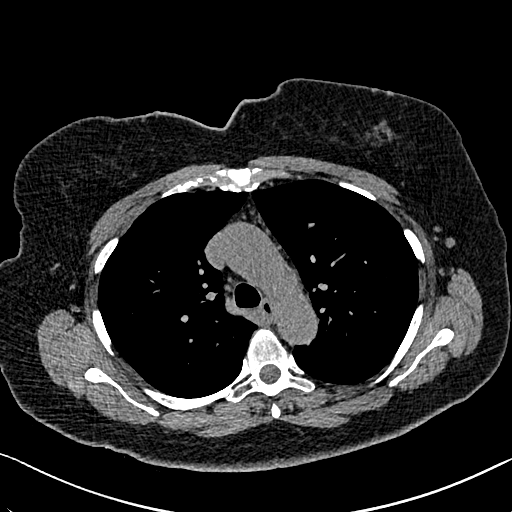
[im 238/357  lung]
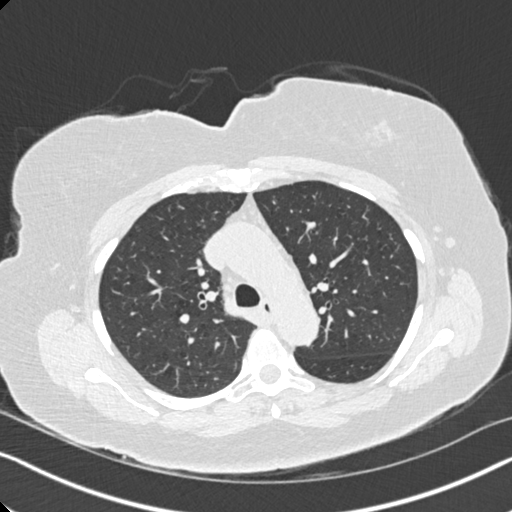
[im 285/357  lung]
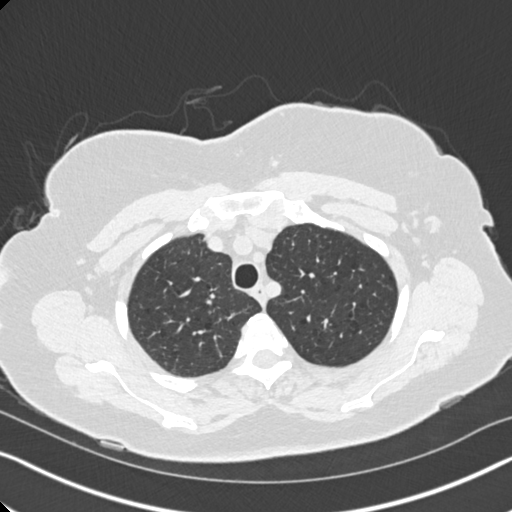
[im 309/357  lung]
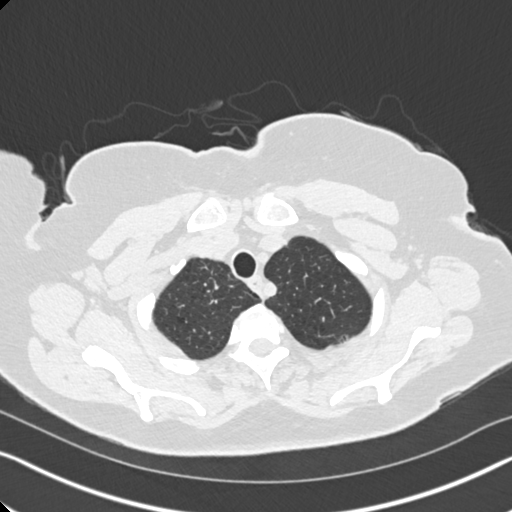
[im 333/357  lung]
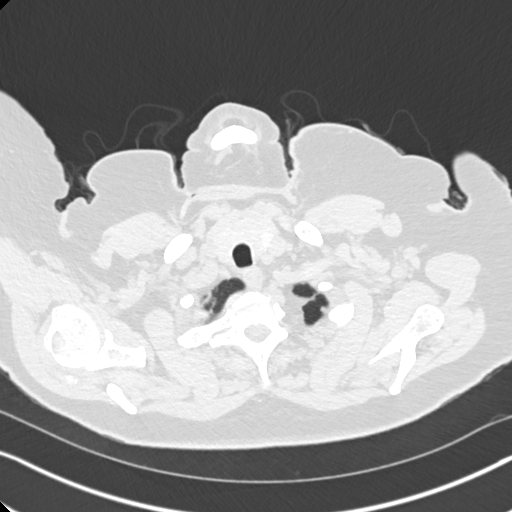

[15 of 36 positions shown; findings below may reference images not displayed]

FINDINGS: Cardiovascular: Normal heart size. No pericardial effusion. Mild
coronary artery calcifications of the LAD and RCA. Mild
atherosclerotic disease of the thoracic aorta.

Mediastinum/Nodes: Small hiatal hernia. Normal thyroid. No
pathologically enlarged lymph nodes seen in the chest. Soft tissue
of the anterior mediastinum with interspersed fat, likely residual
thymus.

Lungs/Pleura: Central airways are patent. Mild centrilobular
emphysema. Mild ground-glass opacities of the right middle lobe.
Mild bibasilar linear opacities, likely due to atelectasis. No
consolidation, pleural effusion or pneumothorax.

Upper Abdomen: No acute abnormality.

Musculoskeletal: No chest wall mass or suspicious bone lesions
identified.
IMPRESSION: 1. Mild ground-glass opacities of the right middle lobe, likely
infectious or inflammatory.
2. Mild coronary artery calcifications of the LAD and RCA.
3. Aortic Atherosclerosis (R68B2-O2Q.Q) and Emphysema (R68B2-HX7.1).

## 2023-12-25 DIAGNOSIS — M159 Polyosteoarthritis, unspecified: Secondary | ICD-10-CM | POA: Diagnosis not present

## 2023-12-25 DIAGNOSIS — M150 Primary generalized (osteo)arthritis: Secondary | ICD-10-CM | POA: Diagnosis not present

## 2023-12-25 DIAGNOSIS — I10 Essential (primary) hypertension: Secondary | ICD-10-CM | POA: Diagnosis not present

## 2023-12-25 DIAGNOSIS — E785 Hyperlipidemia, unspecified: Secondary | ICD-10-CM | POA: Diagnosis not present

## 2023-12-25 DIAGNOSIS — R634 Abnormal weight loss: Secondary | ICD-10-CM | POA: Diagnosis not present

## 2023-12-25 DIAGNOSIS — E559 Vitamin D deficiency, unspecified: Secondary | ICD-10-CM | POA: Diagnosis not present

## 2023-12-25 DIAGNOSIS — E1169 Type 2 diabetes mellitus with other specified complication: Secondary | ICD-10-CM | POA: Diagnosis not present

## 2023-12-25 DIAGNOSIS — Z23 Encounter for immunization: Secondary | ICD-10-CM | POA: Diagnosis not present

## 2023-12-25 DIAGNOSIS — E0800 Diabetes mellitus due to underlying condition with hyperosmolarity without nonketotic hyperglycemic-hyperosmolar coma (NKHHC): Secondary | ICD-10-CM | POA: Diagnosis not present
